# Patient Record
Sex: Female | Born: 1960 | Race: White | Hispanic: No | Marital: Married | Smoking: Never smoker
Health system: Southern US, Community
[De-identification: ages and names within clinical notes are randomized; demographics above are authoritative.]

## PROBLEM LIST (undated history)

## (undated) DIAGNOSIS — R002 Palpitations: Secondary | ICD-10-CM

## (undated) DIAGNOSIS — I493 Ventricular premature depolarization: Secondary | ICD-10-CM

## (undated) DIAGNOSIS — R519 Headache, unspecified: Secondary | ICD-10-CM

## (undated) HISTORY — DX: Ventricular premature depolarization: I49.3

## (undated) HISTORY — DX: Headache, unspecified: R51.9

## (undated) HISTORY — PX: MYOMECTOMY: SHX85

## (undated) HISTORY — DX: Palpitations: R00.2

## (undated) HISTORY — PX: TONSILLECTOMY: SUR1361

## (undated) HISTORY — PX: BREAST REDUCTION SURGERY: SHX8

---
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
## 1998-04-30 ENCOUNTER — Ambulatory Visit (HOSPITAL_COMMUNITY): Admission: RE | Admit: 1998-04-30 | Discharge: 1998-04-30 | Payer: Self-pay | Admitting: Obstetrics & Gynecology

## 1999-07-29 ENCOUNTER — Other Ambulatory Visit: Admission: RE | Admit: 1999-07-29 | Discharge: 1999-07-29 | Payer: Self-pay | Admitting: Obstetrics & Gynecology

## 2000-05-28 ENCOUNTER — Ambulatory Visit (HOSPITAL_COMMUNITY): Admission: RE | Admit: 2000-05-28 | Discharge: 2000-05-28 | Payer: Self-pay | Admitting: Obstetrics & Gynecology

## 2000-05-28 ENCOUNTER — Encounter: Payer: Self-pay | Admitting: Obstetrics & Gynecology

## 2000-11-29 ENCOUNTER — Other Ambulatory Visit: Admission: RE | Admit: 2000-11-29 | Discharge: 2000-11-29 | Payer: Self-pay | Admitting: Obstetrics & Gynecology

## 2000-12-14 HISTORY — PX: MYOMECTOMY: SHX85

## 2000-12-19 ENCOUNTER — Inpatient Hospital Stay (HOSPITAL_COMMUNITY): Admission: AD | Admit: 2000-12-19 | Discharge: 2000-12-19 | Payer: Self-pay | Admitting: Obstetrics & Gynecology

## 2000-12-24 ENCOUNTER — Observation Stay (HOSPITAL_COMMUNITY): Admission: AD | Admit: 2000-12-24 | Discharge: 2000-12-25 | Payer: Self-pay | Admitting: Obstetrics and Gynecology

## 2001-01-29 ENCOUNTER — Inpatient Hospital Stay (HOSPITAL_COMMUNITY): Admission: RE | Admit: 2001-01-29 | Discharge: 2001-01-31 | Payer: Self-pay | Admitting: Obstetrics & Gynecology

## 2001-02-01 ENCOUNTER — Inpatient Hospital Stay (HOSPITAL_COMMUNITY): Admission: AD | Admit: 2001-02-01 | Discharge: 2001-02-04 | Payer: Self-pay | Admitting: Obstetrics & Gynecology

## 2001-02-01 ENCOUNTER — Encounter (INDEPENDENT_AMBULATORY_CARE_PROVIDER_SITE_OTHER): Payer: Self-pay

## 2001-02-02 ENCOUNTER — Encounter: Payer: Self-pay | Admitting: Obstetrics & Gynecology

## 2001-03-16 ENCOUNTER — Inpatient Hospital Stay (HOSPITAL_COMMUNITY): Admission: RE | Admit: 2001-03-16 | Discharge: 2001-03-20 | Payer: Self-pay | Admitting: Obstetrics & Gynecology

## 2001-03-16 ENCOUNTER — Encounter (INDEPENDENT_AMBULATORY_CARE_PROVIDER_SITE_OTHER): Payer: Self-pay | Admitting: Specialist

## 2001-03-17 ENCOUNTER — Encounter: Payer: Self-pay | Admitting: Obstetrics & Gynecology

## 2002-02-21 ENCOUNTER — Encounter: Payer: Self-pay | Admitting: Obstetrics & Gynecology

## 2002-02-21 ENCOUNTER — Ambulatory Visit (HOSPITAL_COMMUNITY): Admission: RE | Admit: 2002-02-21 | Discharge: 2002-02-21 | Payer: Self-pay | Admitting: Obstetrics & Gynecology

## 2002-06-20 ENCOUNTER — Other Ambulatory Visit: Admission: RE | Admit: 2002-06-20 | Discharge: 2002-06-20 | Payer: Self-pay | Admitting: Obstetrics & Gynecology

## 2002-12-26 ENCOUNTER — Inpatient Hospital Stay (HOSPITAL_COMMUNITY): Admission: AD | Admit: 2002-12-26 | Discharge: 2002-12-30 | Payer: Self-pay | Admitting: Obstetrics & Gynecology

## 2003-02-08 ENCOUNTER — Other Ambulatory Visit: Admission: RE | Admit: 2003-02-08 | Discharge: 2003-02-08 | Payer: Self-pay | Admitting: Obstetrics & Gynecology

## 2003-10-17 ENCOUNTER — Ambulatory Visit (HOSPITAL_COMMUNITY): Admission: RE | Admit: 2003-10-17 | Discharge: 2003-10-17 | Payer: Self-pay | Admitting: Obstetrics & Gynecology

## 2004-07-11 ENCOUNTER — Other Ambulatory Visit: Admission: RE | Admit: 2004-07-11 | Discharge: 2004-07-11 | Payer: Self-pay | Admitting: Obstetrics & Gynecology

## 2004-12-10 ENCOUNTER — Ambulatory Visit (HOSPITAL_COMMUNITY): Admission: RE | Admit: 2004-12-10 | Discharge: 2004-12-10 | Payer: Self-pay | Admitting: Obstetrics & Gynecology

## 2005-10-19 ENCOUNTER — Other Ambulatory Visit: Admission: RE | Admit: 2005-10-19 | Discharge: 2005-10-19 | Payer: Self-pay | Admitting: Obstetrics & Gynecology

## 2006-03-10 ENCOUNTER — Ambulatory Visit (HOSPITAL_COMMUNITY): Admission: RE | Admit: 2006-03-10 | Discharge: 2006-03-10 | Payer: Self-pay | Admitting: Obstetrics & Gynecology

## 2007-05-27 ENCOUNTER — Ambulatory Visit (HOSPITAL_COMMUNITY): Admission: RE | Admit: 2007-05-27 | Discharge: 2007-05-27 | Payer: Self-pay | Admitting: Obstetrics & Gynecology

## 2008-06-14 ENCOUNTER — Ambulatory Visit (HOSPITAL_COMMUNITY): Admission: RE | Admit: 2008-06-14 | Discharge: 2008-06-14 | Payer: Self-pay | Admitting: Obstetrics & Gynecology

## 2008-08-29 ENCOUNTER — Encounter: Admission: RE | Admit: 2008-08-29 | Discharge: 2008-08-29 | Payer: Self-pay | Admitting: Obstetrics & Gynecology

## 2009-12-17 ENCOUNTER — Ambulatory Visit (HOSPITAL_COMMUNITY): Admission: RE | Admit: 2009-12-17 | Discharge: 2009-12-17 | Payer: Self-pay | Admitting: Obstetrics & Gynecology

## 2010-12-30 ENCOUNTER — Ambulatory Visit (HOSPITAL_COMMUNITY)
Admission: RE | Admit: 2010-12-30 | Discharge: 2010-12-30 | Payer: Self-pay | Source: Home / Self Care | Attending: Obstetrics & Gynecology | Admitting: Obstetrics & Gynecology

## 2011-01-04 ENCOUNTER — Encounter: Payer: Self-pay | Admitting: Obstetrics & Gynecology

## 2011-05-01 NOTE — H&P (Signed)
[facility]  Patient:    Lisa Kelley, Lisa Kelley                         MRN: [id number] Adm. Date:  [id number] Disc. Date: [id number] Attending:  Minette Headland                         History and Physical  ADMITTING DIAGNOSIS:          Multiple uterine leiomyomata.  HISTORY OF PRESENT ILLNESS:   Patient is a 50 year old white married female gravida 1, para 0, who recently had a pregnancy complicated by second-trimester bleeding, incompetent cervix, and preterm contractions probably as a secondary result of uterine fibroids and bleeding secondary to these.  She had an abdominal cerclage during that pregnancy in February of this year but subsequently had a spontaneous rupture of membranes and preterm nonviable birth.  This occurred on February 18.  She has been followed since that time with a gradual decrease in size of the uterus, but it has essentially stabilized at 12 to 14 week size with numerous irregular nodules confirmed by ultrasound.  Given that the fibroids are thought to have contributed to her pregnancy loss and her desire to conceive again, she is now admitted for myomectomy.  REVIEW OF SYSTEMS:            Her current review of systems is otherwise negative.  There are no cardiopulmonary, GI, or GU complaints.  PAST MEDICAL HISTORY:         Patient has no known significant medical illnesses.  She has previously had rhinoplasty at age 59, a tonsillectomy as a child, and she had the abdominal cerclage noted above.  She had excision of a benign tumor of her upper lip at the age of 6 weeks and several reconstructive plastic surgeries.  She has never had a blood transfusion.  She is not currently on medications on a chronic basis.  She does not use cigarettes.  FAMILY HISTORY:               Noncontributory.  PHYSICAL EXAMINATION:  HEENT:                        Grossly within normal limits.  Thyroid gland is not palpably enlarged.  CHEST:                         Clear to auscultation.  HEART:                        Normal sinus rhythm without murmurs, rubs, or gallops.  ABDOMEN:                      Soft.  There is a midline well-healed scar below the umbilicus.  The uterus is palpable above the symphysis and, on bimanual, the uterus is approximately [redacted] weeks gestational size.  There are no palpable adnexal masses, but the exam is compromised by the large size of the fibroids.  PELVIC:                       The cervix and vagina are normal in appearance at this point in time.  The external genitalia are normal.  ASSESSMENT:  Multiple uterine Leiomyomata.  Myomas thought to be precipitating factor in the loss of a second-trimester pregnancy.  PLAN:                         Myomectomy. DD:  03/15/01 TD:  03/15/01 Job: [id number]

## 2011-05-01 NOTE — H&P (Signed)
[facility] of [facility]  Patient:    Lisa Kelley, Lisa Kelley                         MRN: [id number] Adm. Date:  [id number] Attending:  Trevor Iha                         History and Physical  HISTORY OF PRESENT ILLNESS:   Ms. Standiford is a 50 year old G1, P0 who presented for evaluation by Dr. Jennette Kettle this morning for worsening pelvic pain.  She is prima gravida that has had severe pelvic pain for the last week to week and a half.  She is currently 13 5/7 weeks by last menstrual period.  She is known to have fibroids and presents with vaginal bleeding.  Her estimated date of confinement is June 26, 2001.  She has been on Vicodin and bed rest for the last week because of the pelvic pain.  Ultrasound at the office the fetus measured approximately 15 weeks with a fetal heart rate of 171.  No gross abnormalities were noted.  Two fibroids were noted on the posterior fundus 4.8 x 4.5 x 5.4 cm and the right lateral 7.2 x 6 x 6.5 cm were noted.  Physical examination per Dr. Jennette Kettle:  The cervix was bulging and approximately a 2 cm in length cervix was closed.  IMPRESSION:                   Threatened abortion, probable infarcted fibroids.  PLAN:                         Supportive care with IV fluids.  Morphine as needed for pain.  We will begin Motrin for the anti-inflammatory type of infection and hopefully reduce prostaglandins associated with an infarcted fibroid.  We will continue observation in the hospital during the time of severe pain.  LABORATORIES:                 Blood type O+.  Antibody negative.  Rubella immune.  Hemoglobin 12.3. DD:  12/24/00 TD:  12/24/00 Job: [id number]

## 2011-05-01 NOTE — Discharge Summary (Signed)
NAME:  Lisa Kelley, Lisa Kelley                            ACCOUNT NO.:  [id number]   MEDICAL RECORD NO.:  [id number]                   PATIENT TYPE:  INP   LOCATION:  9135                                 FACILITY:  [facility]   PHYSICIAN:  Freddy Finner, M.D.                DATE OF BIRTH:  [birth year]   DATE OF ADMISSION:  12/26/2002  DATE OF DISCHARGE:  12/30/2002                                 DISCHARGE SUMMARY   ADMISSION DIAGNOSES:  1. Intrauterine pregnancy at 37-5/7 weeks estimated gestational age.  2. Surgically scarred uterus.  3. 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   DISCHARGE DIAGNOSES:  1. Status post low transverse cesarean section.  2. Viable female infant.   PROCEDURE:  Primary low transverse cesarean section.   REASON FOR ADMISSION:  Please see dictated H&P.   HOSPITAL COURSE:  The patient is a 50 year old, white married female, G2, P1  that presented to [facility] for a scheduled cesarean  delivery.  The patient had history of a second-trimester loss with her first  pregnancy due to incompetent cervix.  The patient had an abdominal Cerclage  placed at the time of a myomectomy in April 2002.  Pregnancy had otherwise  been uncomplicated.  Due to history of cervical incompetence, Delalutin had  been administered weekly for the first 10 weeks of her pregnancy.  Amniocentesis had been scheduled for lung maturity on the day preceding the  cesarean section, however, adequate pocket of fluid was not achievable  during the course of that amniocentesis.  On the a.m. of her surgery, the  patient was prepped accordingly and taken to the operating room where spinal  anesthesia was administered without difficulty.  A low transverse incision  was made with the delivery of a viable female infant weighing 5 pounds 15  ounces with Apgar's at 9 and 9 at one and five minutes.  The patient  tolerated the procedure well and was taken to the recovery room in  stable  condition.  On postop day #1, urine was noted with gross hematuria.  Urine  output was within normal limits.  The patient had good return of bowel  function.  Abdomen was soft and slightly distended.  Abdominal dressing was  noted to be clean, dry and intact.  Labs revealed hemoglobin of 9.3,  platelet count of 140,000 and WBC of 7.9.  On postop day #2, the patient  complained of some pressure with voiding.  Vital signs were stable.  Abdomen  was soft and nontender.  Incision was clean, dry and intact.  The patient  was tolerating a regular diet without complaints of nausea and vomiting.  Ferrous sulfate and iron supplementation was started.  On postop day #3,  incision was clean, dry and intact.  The patient was ambulating well.  On  postop day #4, the patient was voiding without difficulty.  The incision  was  clean, dry and intact.  Staples were removed and the patient was discharged  home.   CONDITION ON DISCHARGE:  Good.   DIET:  Regular, as tolerated.   ACTIVITY:  No heavy lifting, no driving x2 weeks, no vaginal entry.   FOLLOW UP:  The patient is to follow up in the office in one to two weeks  for an incision check.   SPECIAL INSTRUCTIONS:  She is to call for temperature greater than 100  degrees, persistent nausea and vomiting, heavy vaginal bleeding and any  redness or drainage from the incisional site.   DISCHARGE MEDICATIONS:  1. Vicodin #30, one p.o. every four to six hours p.r.n. pain with one     refill.  2. Motrin 600 mg every six hours.  3. Ferrous sulfate 325 mg one p.o. b.i.d. with meals.  4. Prenatal vitamins one p.o. daily.  5. Colace one p.o. daily p.r.n.     Julio Sicks, N.P.                        Freddy Finner, M.D.    CC/MEDQ  D:  01/30/2003  T:  01/30/2003  Job:  [id number]

## 2011-05-01 NOTE — Discharge Summary (Signed)
[facility] of [facility]  Patient:    Lisa Kelley, Lisa Kelley                         MRN: [id number] Adm. Date:  [id number] Disc. Date: [id number] Attending:  Minette Headland Dictator:   Danie Chandler, R.N.                           Discharge Summary  ADMITTING DIAGNOSES:          1. Intrauterine pregnancy at approximately 18                                  6/[redacted] weeks gestation with chromosomally normal                                  fetus, incompetent cervix with no cervix to                                  work with vaginally confirmed by clinical and                                  ultrasound examination.                               2. Uterine leiomyomata.  DISCHARGE DIAGNOSES:          1. Intrauterine pregnancy at approximately 18                                  6/[redacted] weeks gestation with chromosomally normal                                  fetus, incompetent cervix with no cervix to                                  work with vaginally confirmed by clinical                                  ultrasound examination.                               2. Uterine leiomyomata.  PROCEDURE:                    On January 28, 2001 abdominal cerclage.  REASON FOR ADMISSION:         The patient is a 50 year old married white female gravida 1, para 0 who has a complicated pregnancy including significant second trimester bleeding, known uterine leiomyomata, and incompetent cervix. After obtaining a normal chromosomal analysis the patient was admitted for an abdominal cerclage.  HOSPITAL COURSE:              The patient was taken to the operating room and  underwent the above named procedure without complications.  On postoperative day #1 the patient was complaining of some right-sided pain with slight bleeding.  The uterus was mildly with mild contractions, positive fetal heart tones.  The patient was placed on magnesium sulfate to relax the uterus.  On postoperative day #1  the patient was having rare bleeding.  Hemoglobin was 9.3.  Fetal heart tones were reactive.  She was having some right nerve pain and she was very sensitive to touch.  Her magnesium sulfate was discontinued and she was placed on Motrin around the clock.  On postoperative day #2 she was feeling much better.  Her abdomen was soft with positive bowel sounds and positive fetal heart tones.  She was discharged home on the following day. She continued to feel better.  She did have good return of bowel function and was tolerating a regular diet.  Hemoglobin was stable at 9.4.  There were positive fetal heart tones.  Her incision was clean, dry, and intact.  She was discharged home on this day.  CONDITION ON DISCHARGE:       Good.  DIET:                         Regular, as tolerated.  ACTIVITY:                     She is to be on bed rest with bathroom privileges until returning to the office for followup visit.  She is to call for any signs or symptoms of infection as well as any unusual pain or bleeding.  DISCHARGE MEDICATIONS:        1. Prenatal vitamins one p.o. q.d.                               2. Vicodin #30 one to two p.o. q.6h. p.r.n.                                  pain.                               3. Zofran 8 mg #10 with no refill as directed by                                  M.D. DD:  02/22/01 TD:  02/22/01 Job: [id number]

## 2011-05-01 NOTE — Discharge Summary (Signed)
[facility] of [facility]  Patient:    CHEN, HOLZMAN                         MRN: [id number] Adm. Date:  [id number] Disc. Date: [id number] Attending:  Minette Headland Dictator:   Danie Chandler, R.N.                           Discharge Summary  ADMITTING DIAGNOSES:          Intrauterine pregnancy at 18+ weeks gestation with incompetent cervix.  DISCHARGE DIAGNOSES:          1. Intrauterine pregnancy at 18+ weeks gestation                                  with incompetent cervix.                               2. Precipitous delivery of a nonviable female on                                  February 03, 2001 at [place] with Apgars of 0                                  and 0.  PROCEDURE:                    Precipitous delivery on February 03, 2001 of a nonviable ______ infant at [employer] 0/0 over an intact perineum.  REASON FOR ADMISSION:         Please see dictated H&P.  HOSPITAL COURSE:              Patient was admitted on February 01, 2001 due to incompetent cervix with progressive cervical change.  She was on p.o. terbutaline.  Her vital signs were stable.  She was placed on Unasyn, continued on terbutaline and on hospitalization day #1 she continued rest and Trendelenburg position but later that day the patient delivered precipitously over an intact perineum a nonviable ______ with Apgars of 0 and 0 at 1835. She was discharged home on the following day in good condition.  LABORATORIES:                 Hemoglobin 9.5, hematocrit 27.9, white blood cell count 8.9.  CONDITION ON DISCHARGE:       Good.  DIET:                         Regular, as tolerated.  ACTIVITY:                     Progressive increase.  FOLLOW-UP:                    One week.  She is to call for any heavy bleeding, any temperature above 100 degrees.  DISCHARGE MEDICATIONS:        1. Prenatal vitamin one p.o. q.d.  2. Iron one p.o. q.d.              3. Vicodin one to two p.o. q.6h. p.r.n. pain. DD:  02/17/01 TD:  02/17/01 Job: [id number]

## 2011-05-01 NOTE — Op Note (Signed)
[facility] of [facility]  Patient:    Lisa Kelley, Lisa Kelley                         MRN: [id number] Proc. Date: 03/16/01 Adm. Date:  [id number] Attending:  Minette Headland                           Operative Report  PREOPERATIVE DIAGNOSES:       Multiple uterine leiomyomata thought to have                               contributed to a second trimester pregnancy                               loss. Abdominal cerclage placed during that                               pregnancy still in place.   POSTOPERATIVE DIAGNOSES:      Multiple uterine leiomyomata thought to have                               contributed to a second trimester pregnancy                               loss. Abdominal cerclage placed during that                               pregnancy still in place.  OPERATION:                    Replacement of cervical cerclage and multiple                               myomectomy total number of 14 fibroids removed                               at least one with definite evidence of                               degeneration and probably three lesions                               distorting the endometrial cavity.  SURGEON:                      Freddy Finner, M.D.  ASSISTANT:                    Trevor Iha, M.D.  ANESTHESIA:                   General endotracheal anesthesia.  ESTIMATED BLOOD LOSS:         600 cc.  INTRAOPERATIVE COMPLICATIONS: None.  INDICATIONS:  Please refer to the history of present illness recorded in the admission note.  DESCRIPTION OF PROCEDURE:     The patient was admitted on the morning of surgery. She was taken to the operating room after receiving a bolus of Ceftin IV. She was placed in PIS hose. Once arrival in the operating room, she was placed under adequate general endotracheal anesthesia and placed in the dorsal recumbent position. The abdomen, perineum and vagina were prepped in the usual fashion  and a Foley catheter was placed using sterile technique. Sterile drapes were applied.  A lower abdominal midline scar was sharply excised. The incision was carried sharply down to fascia which was entered sharply and extended to the extent of the skin incision. The rectus muscles were divided sharply. The peritoneum was entered bluntly with digital exploration and the peritoneal incision extended to the extent of the skin incision. Palpation of the upper abdomen revealed no apparent abnormalities of liver, kidneys or other organs that were obvious. The appendix was visualized and was normal. A self-retaining OConnor-OSullivan retractor was placed. Moist packs were used to pack the intestinal contents out of the pelvis. The uterus was elevated into the incision and in the process of elevating the uterus, the scar in the peritoneum overlying the bladder avulsed revealing the cervix and part of the suture underneath. There was no injury to bladder. Numerous fibroids were then removed individually including at least three dominant ones which impinged on the endometrial cavity and one that was very large and irregular in configuration and traveled through the broad ligament along the right side at the level of the uterine artery. In the process of removing this, it was felt that the uterine artery probably was entered and this was ligated. Each defect created by removal of the fibroids was closed. At least one entered the endometrial cavity and this was carefully inverted in the process of the closure. Great care was exercised to avoid the insertion of the fallopian tubes into the uterine cavity. Exploration of the cavity itself did reveal impingement of at least one fibroid superior to the one noted above which passed through the broad ligament, this too was removed but done through a separate incision. Each incision was closed with deep layers of #0 Monocryl suture and superficial layers with  3-0 PDS suture. After carefully removing all palpable and visible lesions, the old suture was removed, a new suture was placed higher on the cervix at the level of the lower uterine segment. The bladder flap was reapproximated with a 3-0 PDS. All defects in the peritoneum were closed with 3-0 PDS. With adequate hemostasis throughout and after copious irrigation, all instruments were removed; the packs were removed. The sponge, needle, lap and instrument counts were correct. The abdominal incision was closed in two layers, a single layer closed the peritoneum and the fascia with #0 Panacryl. The skin was closed with wide skin staples. The subcutaneous bleeders were controlled with the Bovie prior to closing the skin. The patient tolerated the operative procedure well and was taken to recovery in good condition. DD:  03/16/01 TD:  03/16/01 Job: [id number]

## 2011-05-01 NOTE — H&P (Signed)
NAME:  Lisa Kelley, Lisa Kelley                            ACCOUNT NO.:  [id number]   MEDICAL RECORD NO.:  [id number]                   PATIENT TYPE:   LOCATION:                                       FACILITY:  [facility]   PHYSICIAN:  Freddy Finner, M.D.                DATE OF BIRTH:  [birth year]   DATE OF ADMISSION:  12/26/2002  DATE OF DISCHARGE:                                HISTORY & PHYSICAL   ADMISSION DIAGNOSES:  Intrauterine pregnancy 37 and 3/[redacted] weeks gestation,  incompetent cervix with abdominal cerclage in place, surgically scarred  uterus from previous deep myomectomy.   HISTORY OF PRESENT ILLNESS:  The patient is a 50 year old white married  female, gravida 2, para 0, who had a second trimester pregnancy loss with  her first pregnancy due to incompetent cervix.  At the present time, she has  an abdominal cerclage placed at the time of myomectomy done in April 2002.  This pregnancy has been essentially uncomplicated although followed very,  very closely.  The patient, in addition to having the abdominal cerclage,  was given Delalutin weekly for approximately 10 weeks during the course of  her pregnancy.  She has now advanced to 37 and 3/[redacted] weeks gestation with no  prenatal complications.  Ultrasound on the day prior to admission showed the  infant to be 3155 gm, normal AFI.  An adequate pocket for amniocentesis was  not achievable during the course of that ultrasound, and the planned  amniocentesis was not performed for fear of cord injury to the fetus during  the procedure.  The patient is now admitted for cesarean delivery.   REVIEW OF SYSTEMS:  Negative.  There are no cardiopulmonary, GI or GU  complaints.   PAST MEDICAL HISTORY:  Is outlined in detail in the prenatal summary and  will not be repeated at this time.   The patient is known to have a positive group B strep done on December 13, 2002.   PHYSICAL EXAMINATION:  HEENT:  Grossly within normal limits.  VITAL SIGNS:  Blood  pressure in the office was 110/74.  NECK:  Thyroid gland is not palpably enlarged.  CHEST:  Clear to auscultation.  HEART:  Normal sinus rhythm without murmurs, rubs, or gallops.  ABDOMEN:  Gravid, fundal height of 36 cm.  Ultrasound findings as noted  above.  PELVIC EXAM:  Deferred.  EXTREMITIES:  Without cyanosis, clubbing, or edema.   ASSESSMENT:  Intrauterine pregnancy, 37 and 3/[redacted] weeks gestation, now  admitted for cesarean delivery.                                               Freddy Finner, M.D.    WRN/MEDQ  D:  12/26/2002  T:  12/26/2002  Job:  [id number]

## 2011-05-01 NOTE — Op Note (Signed)
[facility]  Patient:    Lisa Kelley, Lisa Kelley                         MRN: [id number] Proc. Date: 01/28/01 Adm. Date:  [id number] Disc. Date: [id number] Attending:  Trevor Iha                           Operative Report  PREOPERATIVE DIAGNOSES:       1. Intrauterine pregnancy at approximately                                  18-6/7th weeks gestation, chromosomally                                  normal fetus, incompetent cervix, with no                                  cervix to work with vaginally, confirmed                                  by clinical and ultrasound examinations.                               2. Uterine leiomyomata.  POSTOPERATIVE DIAGNOSES:      1. Intrauterine pregnancy at approximately                                  18-6/7th weeks gestation, chromosomally                                  normal fetus, incompetent cervix, with no                                  cervix to work with vaginally, confirmed                                  by clinical and ultrasound examinations.                               2. Uterine leiomyomata.  OPERATION:                    Abdominal cerclage.  SURGEON:                      Freddy Finner, M.D.  ASSISTANT:                    Juluis Mire, M.D.  ANESTHESIA:                   Spinal.  ESTIMATED INTRAOPERATIVE BLOOD LOSS:  50 cc or less.  INTRAOPERATIVE COMPLICATIONS: None.  INDICATIONS:  The patient is a 50 year old gravida 1, who has had a complicated pregnancy, including significant second trimester bleeding, known uterine leiomyomata, and incompetent cervix.  After obtaining a normal chromosomal analysis, she is now admitted for an abdominal cerclage.  DESCRIPTION OF PROCEDURE:     She was admitted on the morning of surgery and brought to the operating room, and there placed under adequate spinal anesthesia.  She was placed in the dorsal recumbent position, with  elevation of the right hip.  The abdomen was prepped in the usual fashion.  A Foley catheter was placed using a sterile technique.  Because of the large size of the uterus, extending above the umbilicus, it was elected to perform a vertical lower abdominal midline incision.  This was done sharply, and extended with careful dissection down to the peritoneum, which was entered sharply and extended sharply to the extent of the skin incision.  The uterus was then delivered through the abdominal incision, and using various retractors, including Deavers, and Richardson retractors, the lower segment was identified.  The bladder flap was developed with a transverse incision overlying the lower uterine segment.  The bladder was pushed off of the cervix.  The vessels were identified at their insertion into the uterus, and a Babcock clamp placed across the uterine artery pedicles on each side sequentially.  Using a 5.0 mm Mersilene tape on the blunt-tipped large needles, the suture was placed medial to the vessels and then tied anteriorly, with a total of four knots.  There was no significant intra-abdominal bleeding, no injury to vessels.  The bladder flap was reapproximated with #3-0 chromic suture.  The uterus was placed back into the abdominal cavity.  The abdominal incision was closed in layers with running #0 Vicryl used to close the peritoneum.  The fascia was closed with running #0 Panacryl.  The skin was closed with wide skin staples.  The patient tolerated the operative procedure well and was taken to the recovery room in good condition. DD:  01/28/01 TD:  01/28/01 Job: [id number]

## 2011-05-01 NOTE — Op Note (Signed)
NAME:  SHACARRA, CHOE                            ACCOUNT NO.:  [id number]   MEDICAL RECORD NO.:  [id number]                   PATIENT TYPE:  INP   LOCATION:  9135                                 FACILITY:  [facility]   PHYSICIAN:  Freddy Finner, M.D.                DATE OF BIRTH:  1961/09/04   DATE OF PROCEDURE:  12/26/2002  DATE OF DISCHARGE:                                 OPERATIVE REPORT   PREOPERATIVE DIAGNOSES:  1. Intrauterine pregnancy, 37-3/7 weeks' gestation.  2. Surgically scarred uterus.  3. Cervical incompetence with cervical cerclage in place.   POSTOPERATIVE DIAGNOSES:  1. Intrauterine pregnancy, 37-3/7 weeks' gestation.  2. Surgically scarred uterus.  3. Cervical incompetence with cervical cerclage in place.  4. Delivery of viable female infant.   OPERATIVE PROCEDURE:  1. Primary low transverse cervical cesarean section.  2. Delivery of viable female infant, Apgars of 9 and 9.   ANESTHESIA:  Spinal.   ESTIMATED BLOOD LOSS:  600 cc.   COMPLICATIONS:  None.   FINDINGS:  Cervical cerclage was in place with knot anterior on the lower  uterine segment.  Adhesions in the cul-de-sac are consistent with placement  of previous cerclage, obscuring view of cerclage posteriorly.  No other  adhesions were noted, no fibroids.  The tubes and ovaries were normal.   INDICATIONS FOR PROCEDURE:  The patient is a 50 year old admitted on the  morning of surgery for delivery by cesarean.  The details of the present  illness are recorded in the admission note.   DESCRIPTION OF PROCEDURE:  She was brought to the operating room and placed  under adequate spinal anesthesia, placed in the dorsal recumbent position  with elevation of the right hip of 15 degrees.  Abdomen was prepped and  draped in the usual fashion.  Foley catheter was placed using sterile  technique.  Sterile drapes were applied.  Low abdominal transverse incision  was made and carried sharply down to the fascia.  The  fascia was entered  sharply and extended to the skin incision.  Rectus sheath was developed  superiorly and inferiorly with a blunt and sharp dissection.  Rectus muscles  were divided in the midline.  Peritoneum was entered sharply and extended to  the skin incision.  Bladder block was placed.  Due to the presence of the  cerclage, it was elected not to dissect the bladder off the lower segment.  An incision was made in the lower uterine segment, approximately 1 cm to 1.5  cm above the reflection of the bladder peritoneum.  This was carried sharply  down to membranes which were entered.  Fluid was clear.  Uterine incision  was fixed into transversely with bandage scissors.  Palpation of the fetal  presentation revealed the infant to be oblique with the head in the right  mid portion of the uterus, the buttocks in the right upper  quadrant.  With  manual manipulation the head was guided to the lower segment and a Kiwi  vacuum extractor placed and an easy delivery then accomplished without  difficulty.  A length of free cord was reduced.  The infant's shoulders were  then delivered.  Apgars were 9 and 9.  Cord blood was obtained for arterial  sample for gases and for routine venous sample.  Placenta and other parts of  conception were removed from the uterus.  This was confirmed by manual  exploration of the uterus.  Uterus was delivered through the abdominal  incision for careful inspection and findings are noted above.  The uterine  incision was then closed in a single layer with running locking 0 Monocryl.  Figure-of-eights were required in three locations for complete hemostasis.  A Bovie was also used for small oozing bleeding sources.  Uterus was  delivered back into the abdominal cavity.  Irrigation was carried out.  All  packs, needles, and instruments were removed.  Counts were correct.  Abdominal incision was then closed in layers.  Running 0 Monocryl was used  to close the  peritoneum and reapproximate the rectus muscles.  Fascia was  closed with running 0 PDS.  Subcutaneous tissue was approximated with  running 2-0 plain.  Skin was closed with rod, skin staples, and quarter inch  Steri-Strips.  The patient tolerated the operative procedure well and was  taken to the recovery room in good condition.                                               Freddy Finner, M.D.    WRN/MEDQ  D:  12/26/2002  T:  12/26/2002  Job:  [id number]

## 2011-05-01 NOTE — Discharge Summary (Signed)
[facility]  Patient:    Lisa Kelley, Lisa Kelley                         MRN: [id number] Adm. Date:  [id number] Disc. Date: [id number] Attending:  Minette Headland                           Discharge Summary  ADMITTING DIAGNOSES:          Multiple uterine leiomyomata with contribution to the previous pregnancy loss, cervical incompetence, presence of cervical cerclage which was replaced in surgery.  OPERATIVE PROCEDURE:          Multiple myomectomy, replacement of abdomen cerclage.  COMPLICATIONS:                Postoperative:  Temperature elevation, slow progression of GI function.  DISPOSITION:                  At the time of her discharge on the fourth postoperative day the patient had been afebrile for at least 24 hours.  She was having adequate bowel and bladder function.  She was ambulating without difficulty.  Her condition was considered to be satisfactory and she was discharged home at that time to be followed up in the office in approximately one week.  She was given appropriate analgesics to be taken by mouth in the form of Vicodin.  She was given iron supplementation for anemia.  She was given oral antibiotics per Dr. Arelia Sneddon and discharged.  The details of the present illness, past history, family history, review of systems, and physical examination are recorded in the admission note and in the operative summary.  The patient had a complicated first pregnancy with incompetent cervix, multiple leiomyomata, placental abruption, cervical incompetence.  It was felt that in this particular case the fibroids more than likely contributed to the loss of that pregnancy and for that reason she is now admitted for myomectomy.  PHYSICAL EXAMINATION  PELVIC:                       Marked enlargement of the uterus to approximately [redacted] weeks gestational size.  LABORATORIES:                 Admission hemoglobin 12.8, postoperative hemoglobin 8.3 on the  first postoperative day, 7.8 on the second, 8.4 later on the second postoperative day, white count elevated on admission, slightly elevated on the second postoperative day.  Prothrombin time, PTT, urinalysis were normal on admission.  HOSPITAL COURSE:              Patient was admitted on the morning of surgery, taken to the operating room where the above described surgical procedure was accomplished including replacement of what was a ______ abdominal cervical cerclage.  Her postoperative course was remarkable for temperature elevation to 101.5 on the second postoperative day which responded rapidly to antibiotics and further conservative measures.  By the morning of the fourth postoperative day her condition was good.  She was discharged with disposition as noted above. DD:  07/12/01 TD:  07/12/01 Job: [id number]

## 2011-08-31 ENCOUNTER — Inpatient Hospital Stay (INDEPENDENT_AMBULATORY_CARE_PROVIDER_SITE_OTHER)
Admission: RE | Admit: 2011-08-31 | Discharge: 2011-08-31 | Disposition: A | Discharge status: Home / Self Care | Payer: 59 | Source: Ambulatory Visit | Attending: Family Medicine | Admitting: Family Medicine

## 2011-08-31 ENCOUNTER — Ambulatory Visit (INDEPENDENT_AMBULATORY_CARE_PROVIDER_SITE_OTHER): Payer: 59

## 2011-08-31 DIAGNOSIS — S93409A Sprain of unspecified ligament of unspecified ankle, initial encounter: Secondary | ICD-10-CM

## 2011-08-31 IMAGING — CR DG ANKLE COMPLETE 3+V*R*
3 series · 3 of 3 positions shown · non-contrast
Comparison: None

CLINICAL DATA: 49-year-old female with right ankle injury and pain.

RIGHT ANKLE - COMPLETE 3+ VIEW

[view not recorded (1 of 3)]
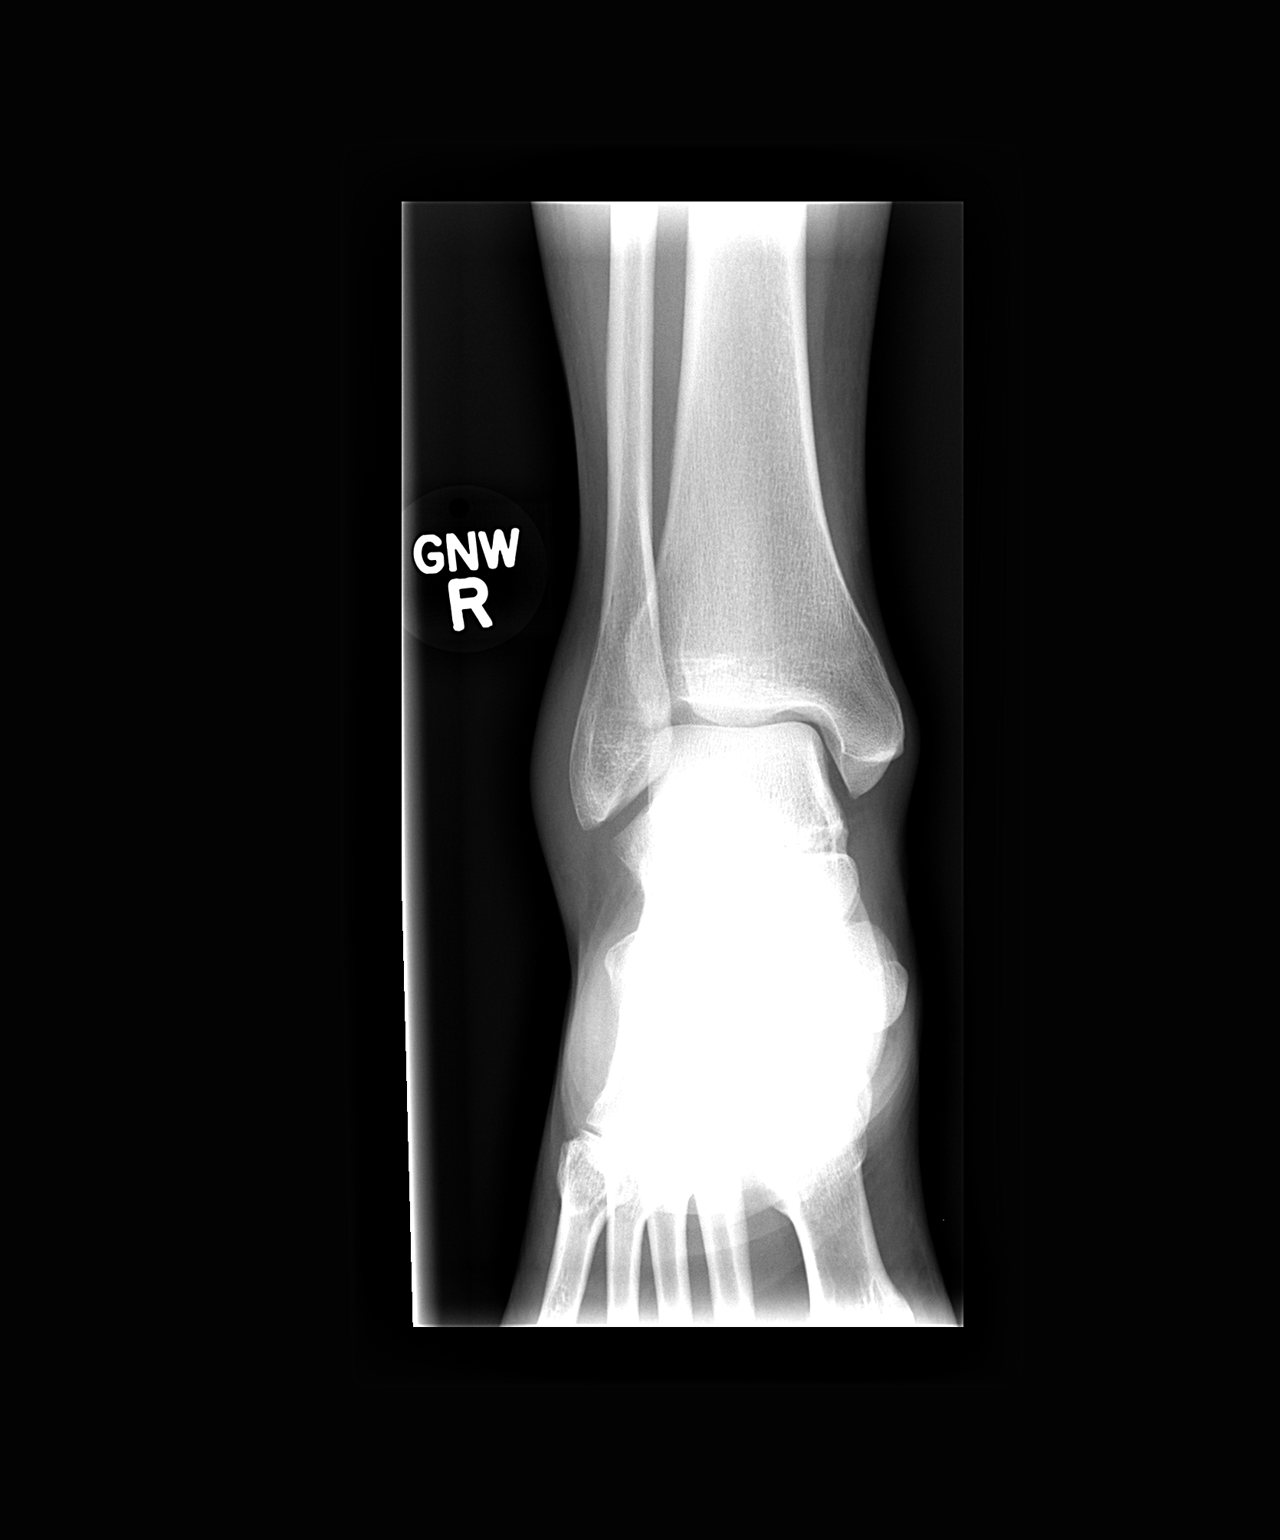

[view not recorded (2 of 3)]
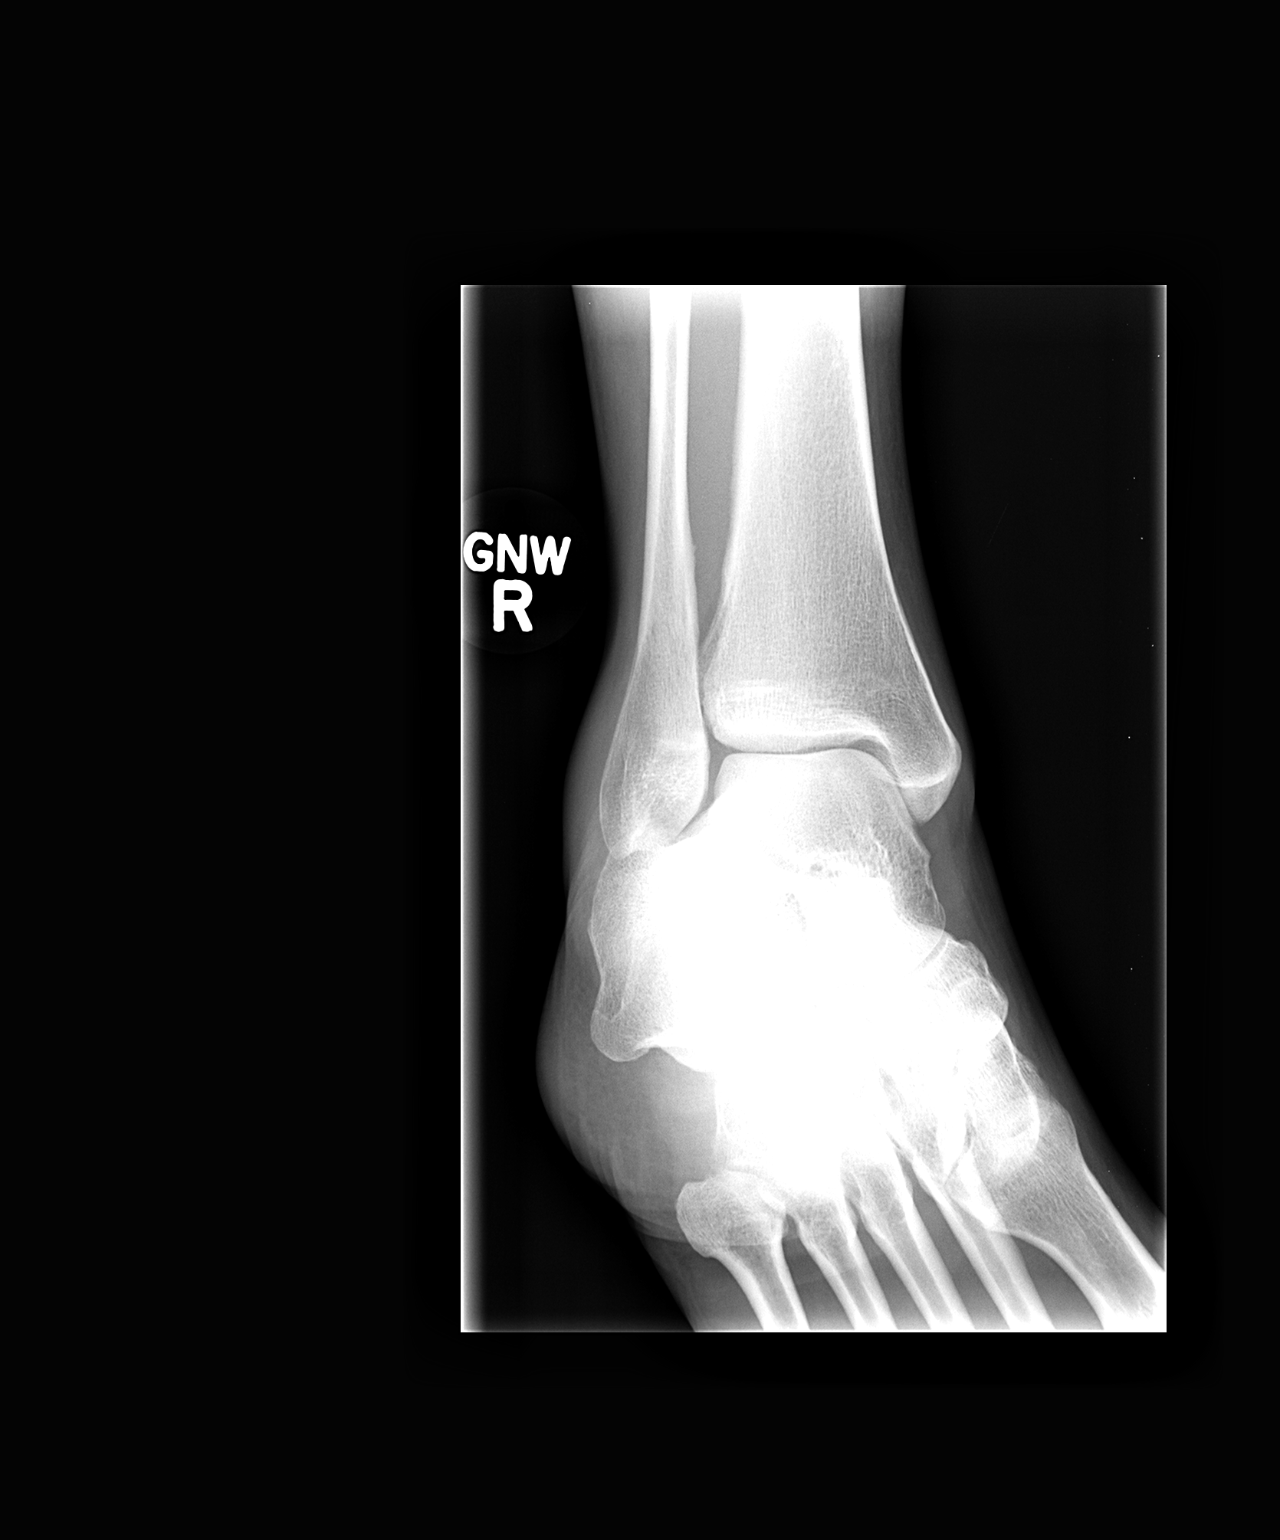

[view not recorded (3 of 3)]
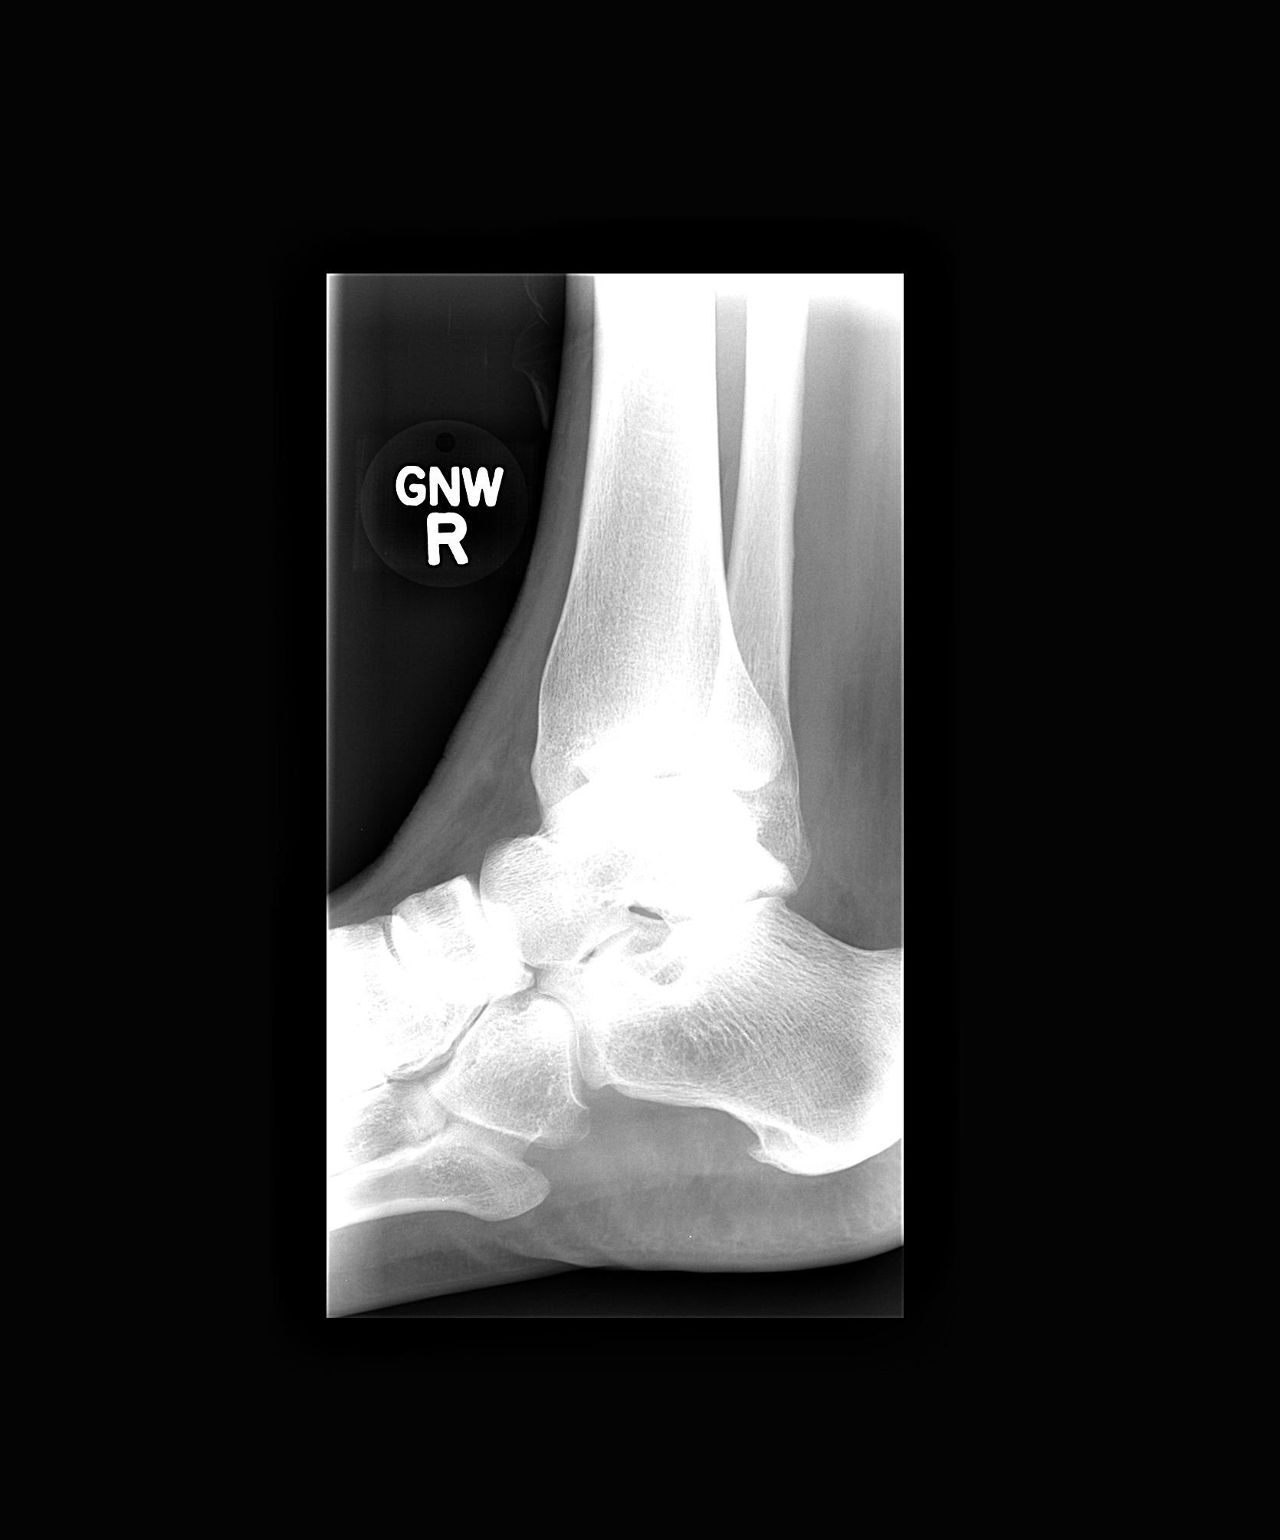

[3 of 3 positions shown; findings below may reference images not displayed]

FINDINGS: No evidence of acute fracture, subluxation or dislocation
identified.

No radio-opaque foreign bodies are present.

No focal bony lesions are noted.

The joint spaces are unremarkable.

Lateral soft tissue swelling is noted.
IMPRESSION: Soft tissue swelling without acute bony abnormality.

## 2013-12-29 ENCOUNTER — Other Ambulatory Visit (HOSPITAL_COMMUNITY): Payer: Self-pay | Admitting: Obstetrics & Gynecology

## 2013-12-29 DIAGNOSIS — Z1231 Encounter for screening mammogram for malignant neoplasm of breast: Secondary | ICD-10-CM

## 2014-01-03 ENCOUNTER — Ambulatory Visit (HOSPITAL_COMMUNITY)
Admission: RE | Admit: 2014-01-03 | Discharge: 2014-01-03 | Disposition: A | Discharge status: Home / Self Care | Payer: PRIVATE HEALTH INSURANCE | Source: Ambulatory Visit | Attending: Obstetrics & Gynecology | Admitting: Obstetrics & Gynecology

## 2014-01-03 DIAGNOSIS — Z1231 Encounter for screening mammogram for malignant neoplasm of breast: Secondary | ICD-10-CM

## 2014-01-03 IMAGING — MG STANDARD SCREENING - COMBOHD
8 series · 9 of 24 positions shown · non-contrast
Comparison: Previous exam(s).

CLINICAL DATA: Screening.

EXAM:
DIGITAL SCREENING BILATERAL MAMMOGRAM WITH 3D TOMO WITH CAD

[R CC]
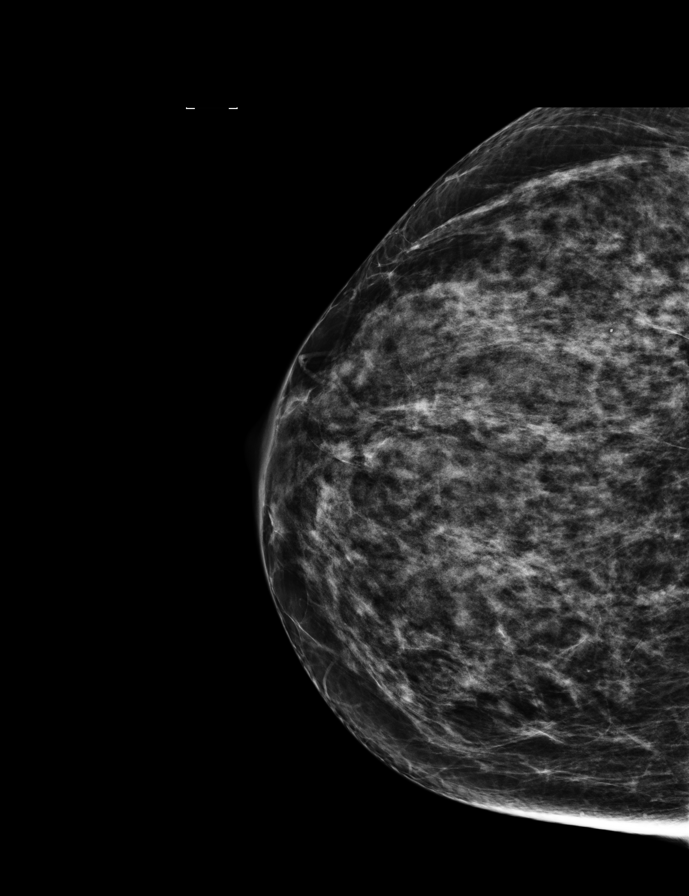

[L CC]
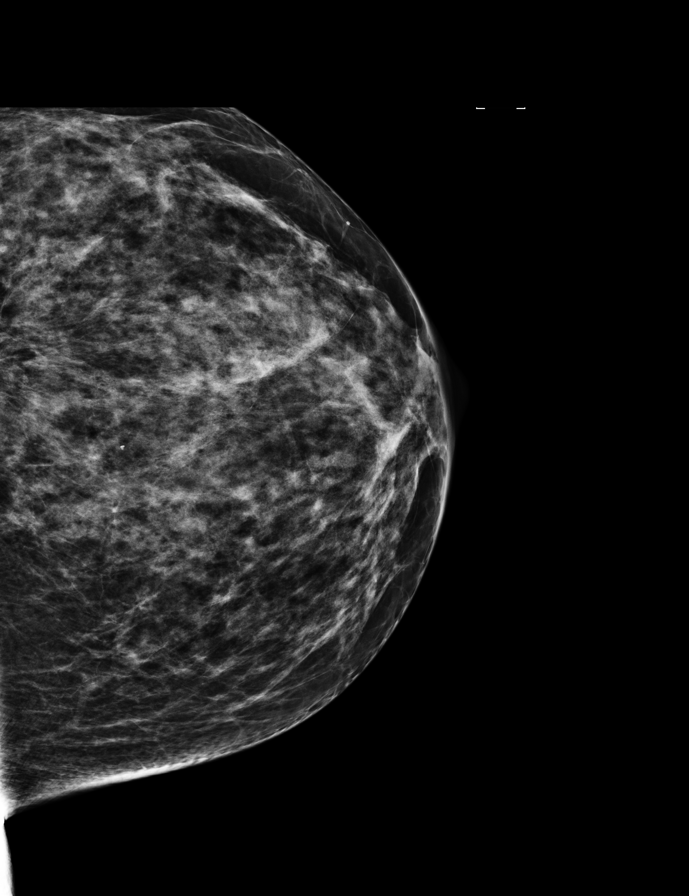

[R MLO]
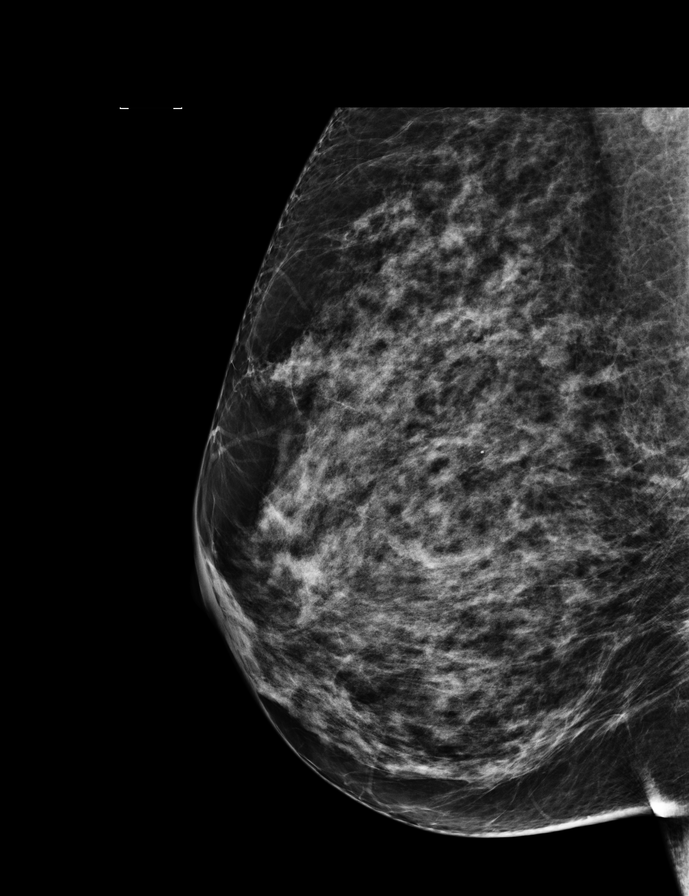

[L MLO]
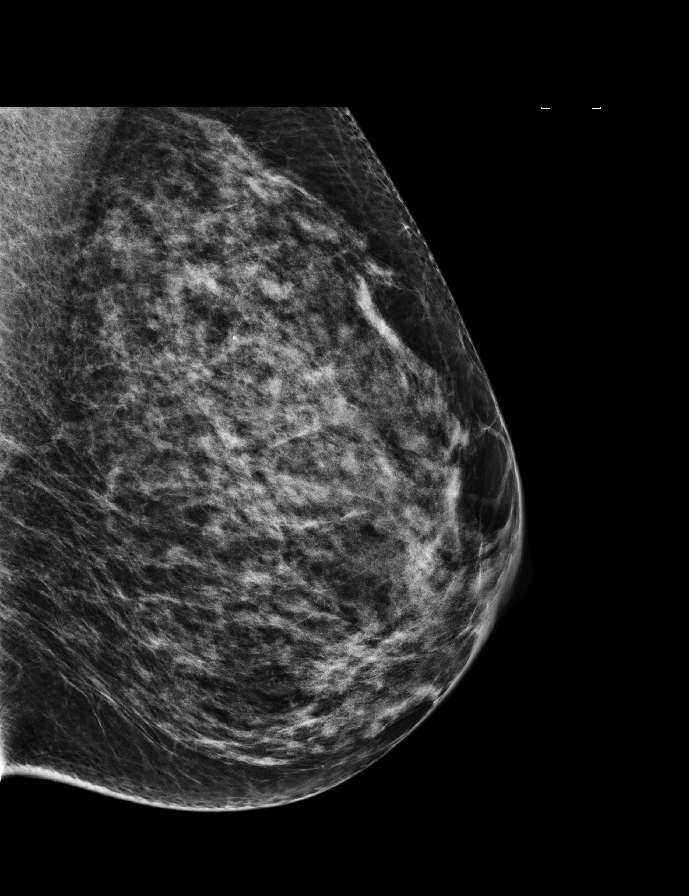

[L CC tomo · 2 of 77 frames shown]
[frame 25/77]
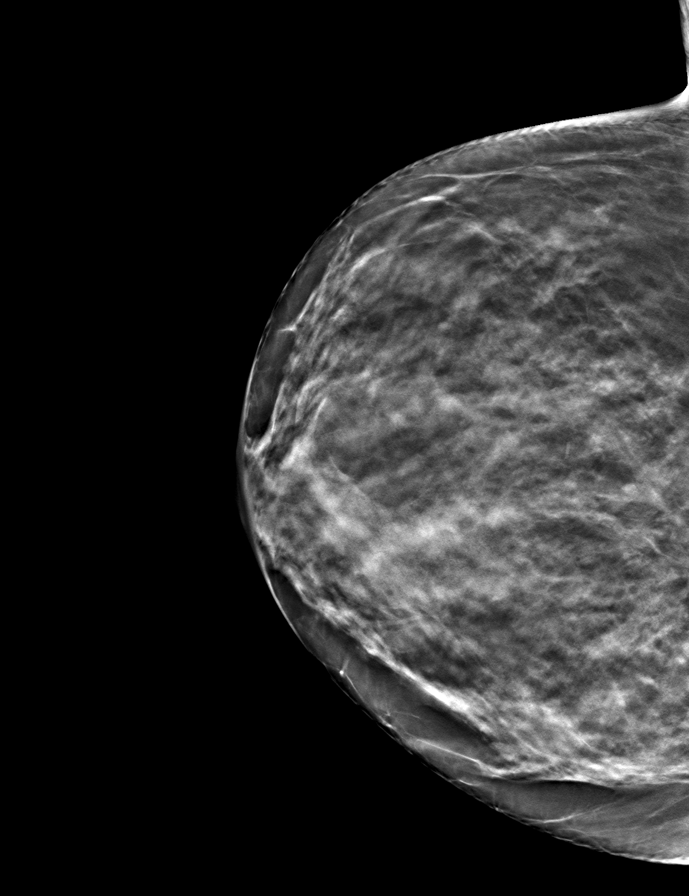
[frame 39/77]
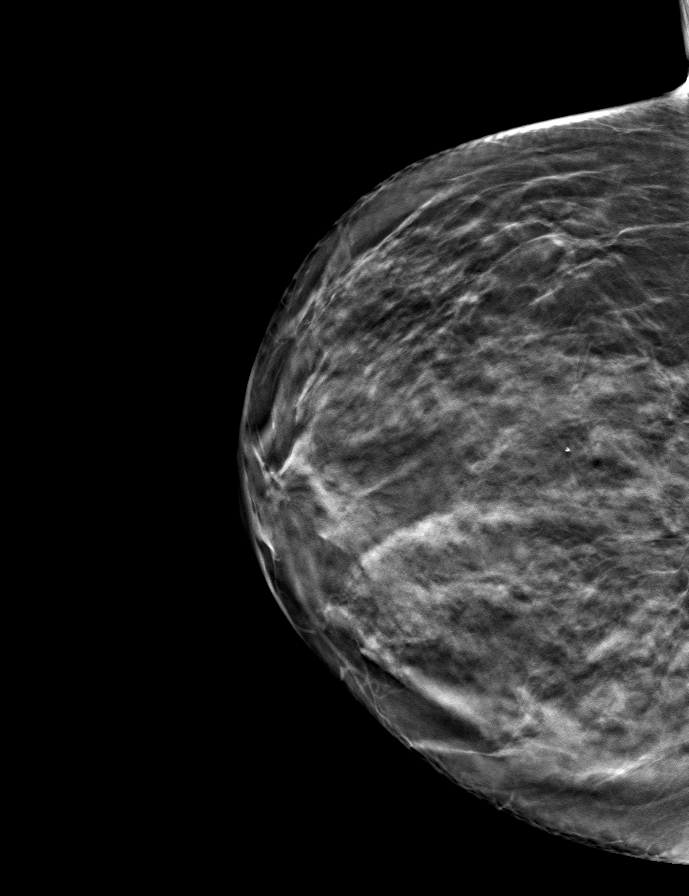

[R MLO tomo · tomo slice 39/78.0]
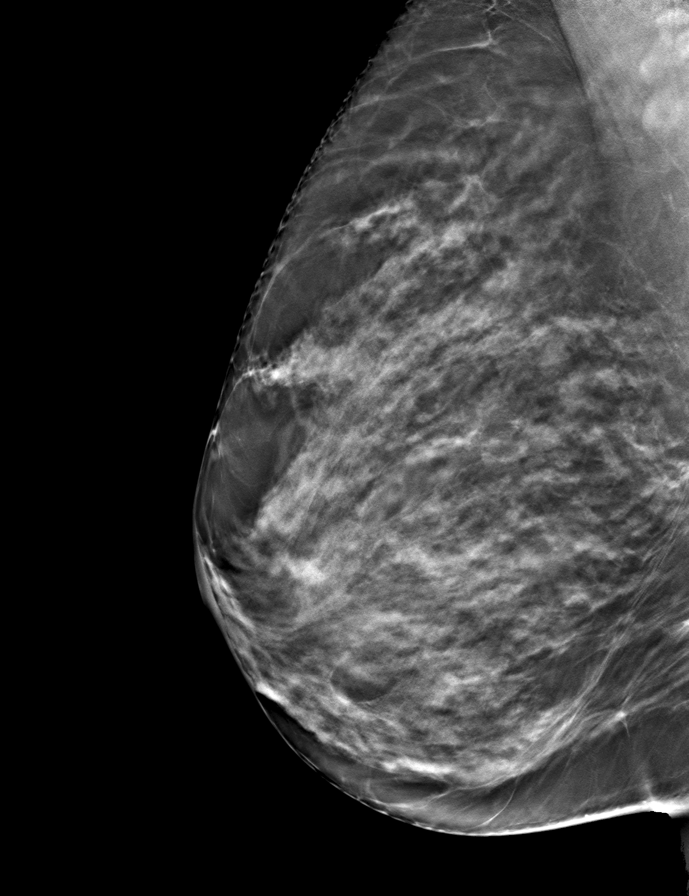

[L MLO tomo · tomo slice 41/80.0]
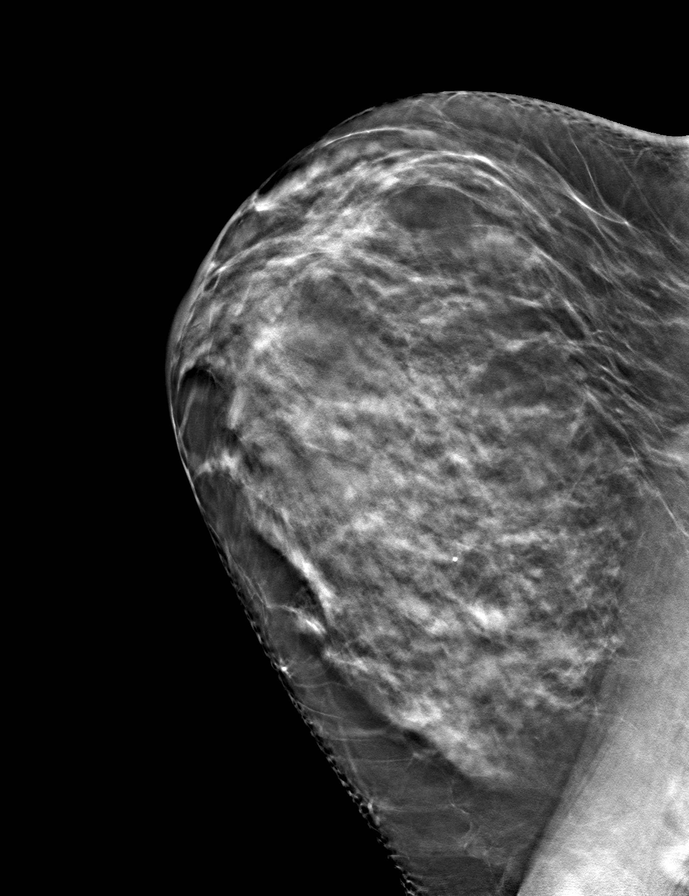

[R CC tomo · tomo slice 39/78.0]
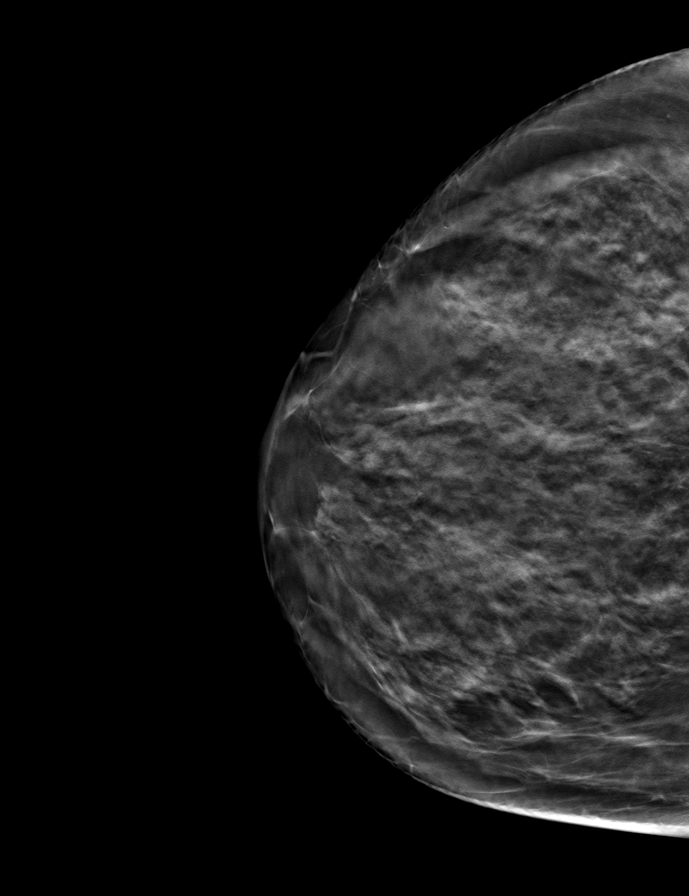

[9 of 24 positions shown; findings below may reference images not displayed]

ACR Breast Density Category c: The breast tissue is heterogeneously
dense, which may obscure small masses.
FINDINGS: There are no findings suspicious for malignancy. Images were
processed with CAD.
IMPRESSION: No mammographic evidence of malignancy. A result letter of this
screening mammogram will be mailed directly to the patient.

RECOMMENDATION:
Screening mammogram in one year. (Code:[SM])

BI-RADS CATEGORY  1: Negative.

## 2016-12-10 DIAGNOSIS — H02831 Dermatochalasis of right upper eyelid: Secondary | ICD-10-CM | POA: Diagnosis not present

## 2016-12-10 DIAGNOSIS — H5213 Myopia, bilateral: Secondary | ICD-10-CM | POA: Diagnosis not present

## 2016-12-10 DIAGNOSIS — H02834 Dermatochalasis of left upper eyelid: Secondary | ICD-10-CM | POA: Diagnosis not present

## 2016-12-14 HISTORY — PX: BLEPHAROPLASTY: SUR158

## 2017-01-13 DIAGNOSIS — D1801 Hemangioma of skin and subcutaneous tissue: Secondary | ICD-10-CM | POA: Diagnosis not present

## 2017-01-13 DIAGNOSIS — K130 Diseases of lips: Secondary | ICD-10-CM | POA: Diagnosis not present

## 2017-01-13 DIAGNOSIS — L821 Other seborrheic keratosis: Secondary | ICD-10-CM | POA: Diagnosis not present

## 2017-01-13 DIAGNOSIS — Z85828 Personal history of other malignant neoplasm of skin: Secondary | ICD-10-CM | POA: Diagnosis not present

## 2017-01-13 DIAGNOSIS — L570 Actinic keratosis: Secondary | ICD-10-CM | POA: Diagnosis not present

## 2017-07-30 DIAGNOSIS — J3489 Other specified disorders of nose and nasal sinuses: Secondary | ICD-10-CM | POA: Diagnosis not present

## 2017-08-12 DIAGNOSIS — J3489 Other specified disorders of nose and nasal sinuses: Secondary | ICD-10-CM | POA: Diagnosis not present

## 2017-08-23 DIAGNOSIS — H02834 Dermatochalasis of left upper eyelid: Secondary | ICD-10-CM | POA: Diagnosis not present

## 2017-08-23 DIAGNOSIS — H02413 Mechanical ptosis of bilateral eyelids: Secondary | ICD-10-CM | POA: Diagnosis not present

## 2017-08-23 DIAGNOSIS — H02423 Myogenic ptosis of bilateral eyelids: Secondary | ICD-10-CM | POA: Diagnosis not present

## 2017-08-23 DIAGNOSIS — H0279 Other degenerative disorders of eyelid and periocular area: Secondary | ICD-10-CM | POA: Diagnosis not present

## 2017-08-23 DIAGNOSIS — H02831 Dermatochalasis of right upper eyelid: Secondary | ICD-10-CM | POA: Diagnosis not present

## 2018-08-20 ENCOUNTER — Encounter (HOSPITAL_BASED_OUTPATIENT_CLINIC_OR_DEPARTMENT_OTHER): Payer: Self-pay | Admitting: Emergency Medicine

## 2018-08-20 ENCOUNTER — Emergency Department (HOSPITAL_BASED_OUTPATIENT_CLINIC_OR_DEPARTMENT_OTHER)
Admission: EM | Admit: 2018-08-20 | Discharge: 2018-08-20 | Disposition: A | Discharge status: Home / Self Care | Payer: BLUE CROSS/BLUE SHIELD | Attending: Emergency Medicine | Admitting: Emergency Medicine

## 2018-08-20 ENCOUNTER — Emergency Department (HOSPITAL_BASED_OUTPATIENT_CLINIC_OR_DEPARTMENT_OTHER): Payer: BLUE CROSS/BLUE SHIELD

## 2018-08-20 ENCOUNTER — Other Ambulatory Visit: Payer: Self-pay

## 2018-08-20 DIAGNOSIS — Y999 Unspecified external cause status: Secondary | ICD-10-CM | POA: Diagnosis not present

## 2018-08-20 DIAGNOSIS — Y939 Activity, unspecified: Secondary | ICD-10-CM | POA: Insufficient documentation

## 2018-08-20 DIAGNOSIS — S93492A Sprain of other ligament of left ankle, initial encounter: Secondary | ICD-10-CM

## 2018-08-20 DIAGNOSIS — X509XXA Other and unspecified overexertion or strenuous movements or postures, initial encounter: Secondary | ICD-10-CM | POA: Diagnosis not present

## 2018-08-20 DIAGNOSIS — S99912A Unspecified injury of left ankle, initial encounter: Secondary | ICD-10-CM | POA: Diagnosis present

## 2018-08-20 DIAGNOSIS — Y929 Unspecified place or not applicable: Secondary | ICD-10-CM | POA: Diagnosis not present

## 2018-08-20 DIAGNOSIS — S93402A Sprain of unspecified ligament of left ankle, initial encounter: Secondary | ICD-10-CM | POA: Diagnosis not present

## 2018-08-20 IMAGING — DX DG ANKLE COMPLETE 3+V*L*
3 series · 3 of 3 positions shown · non-contrast
Comparison: None.

CLINICAL DATA: 56-year-old female with a history of fall

EXAM:
LEFT ANKLE COMPLETE - 3+ VIEW

[ankle ap]
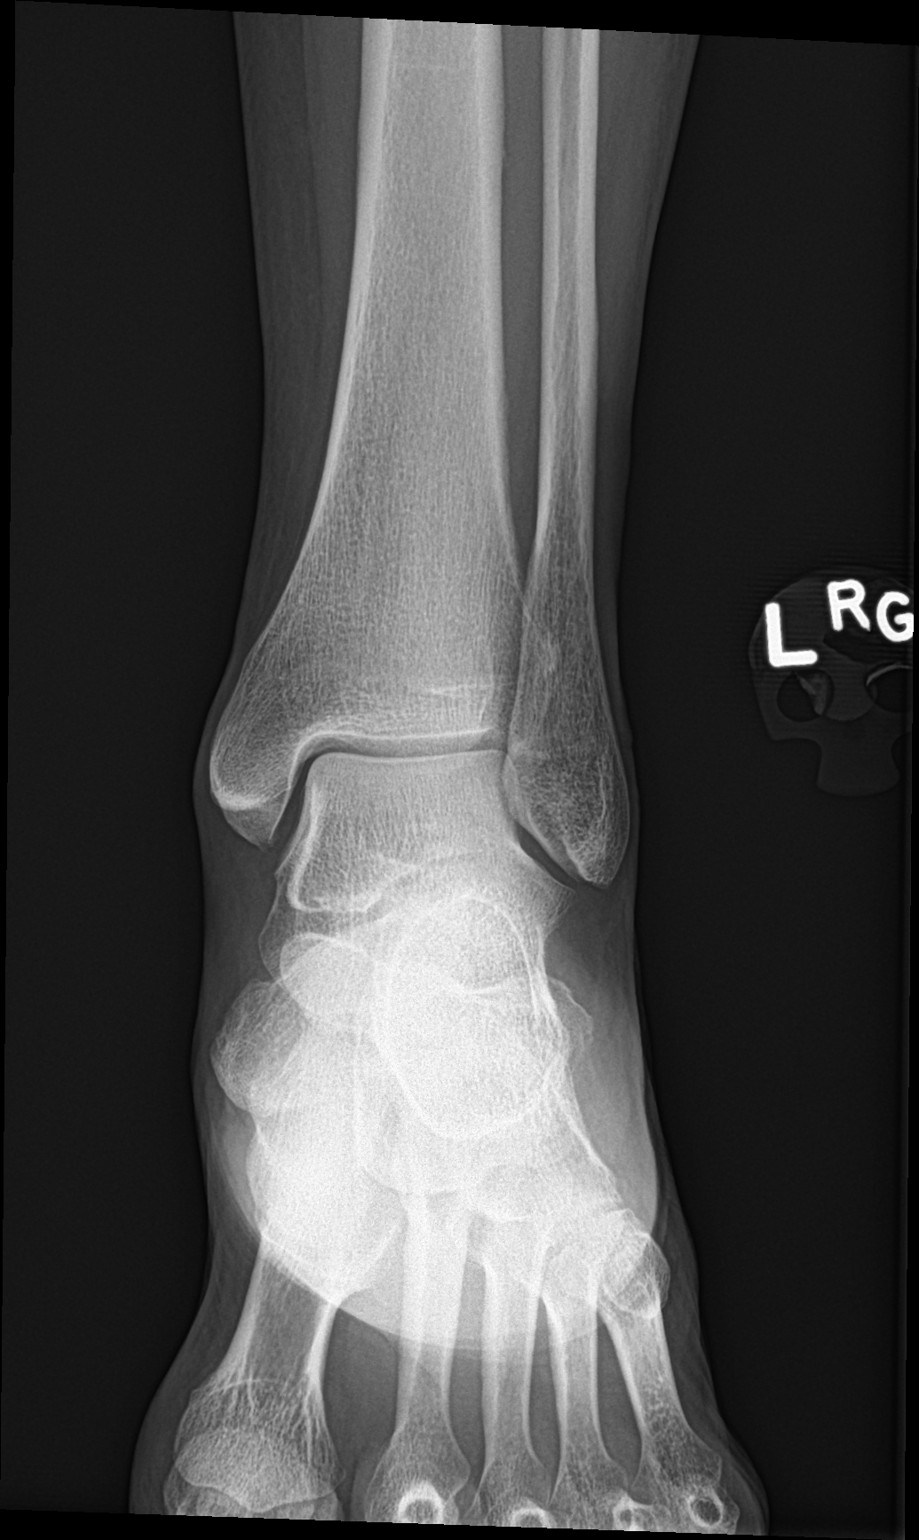

[ankle obl]
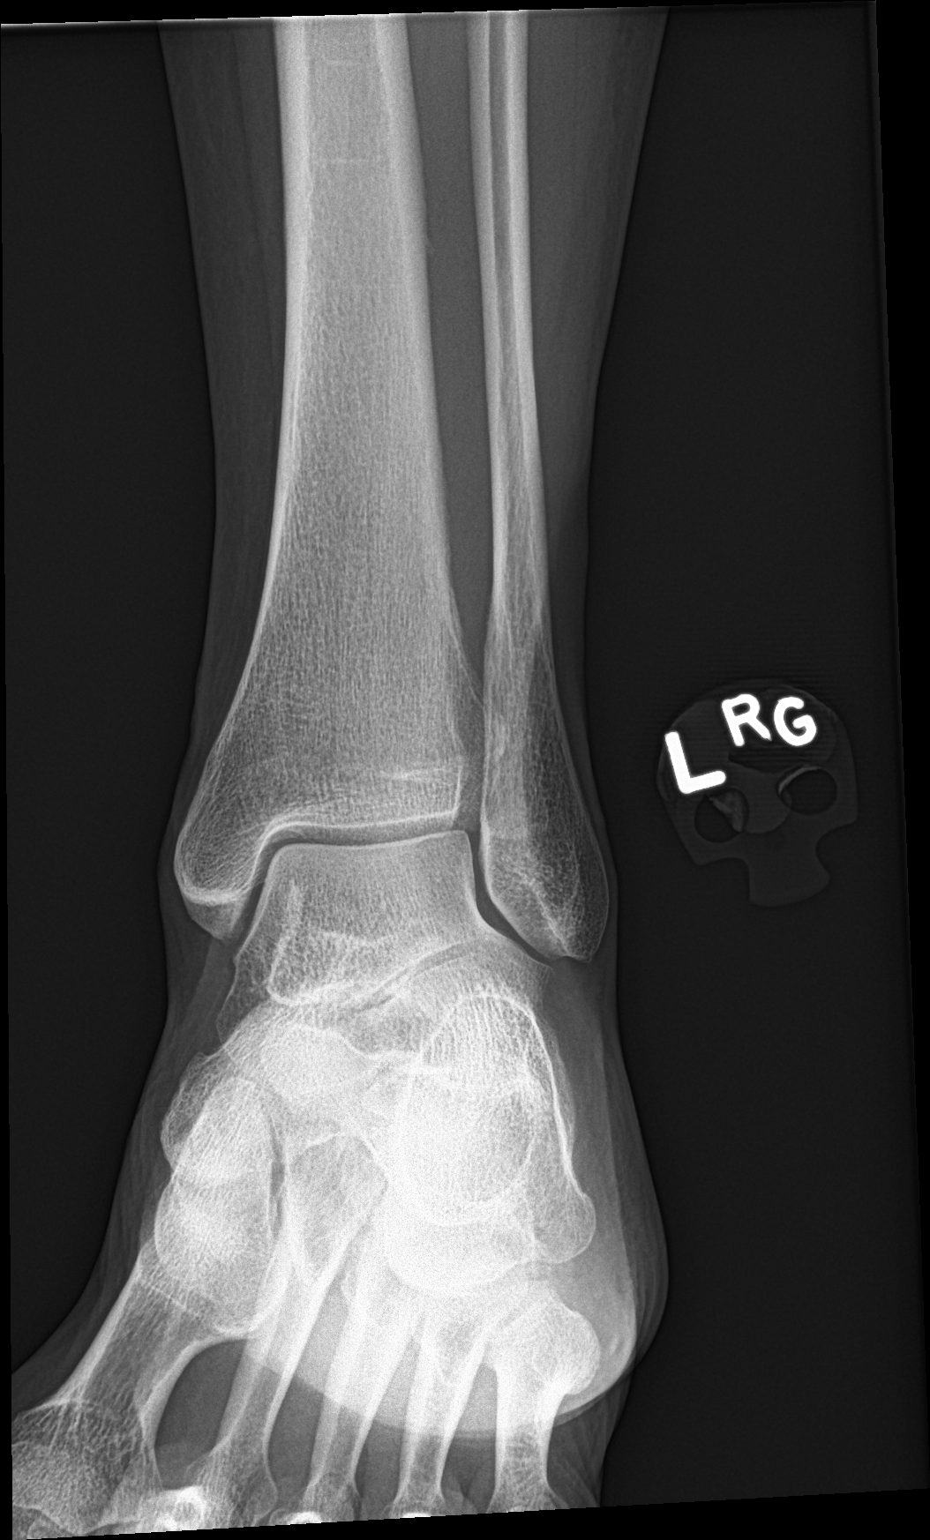

[ankle lat]
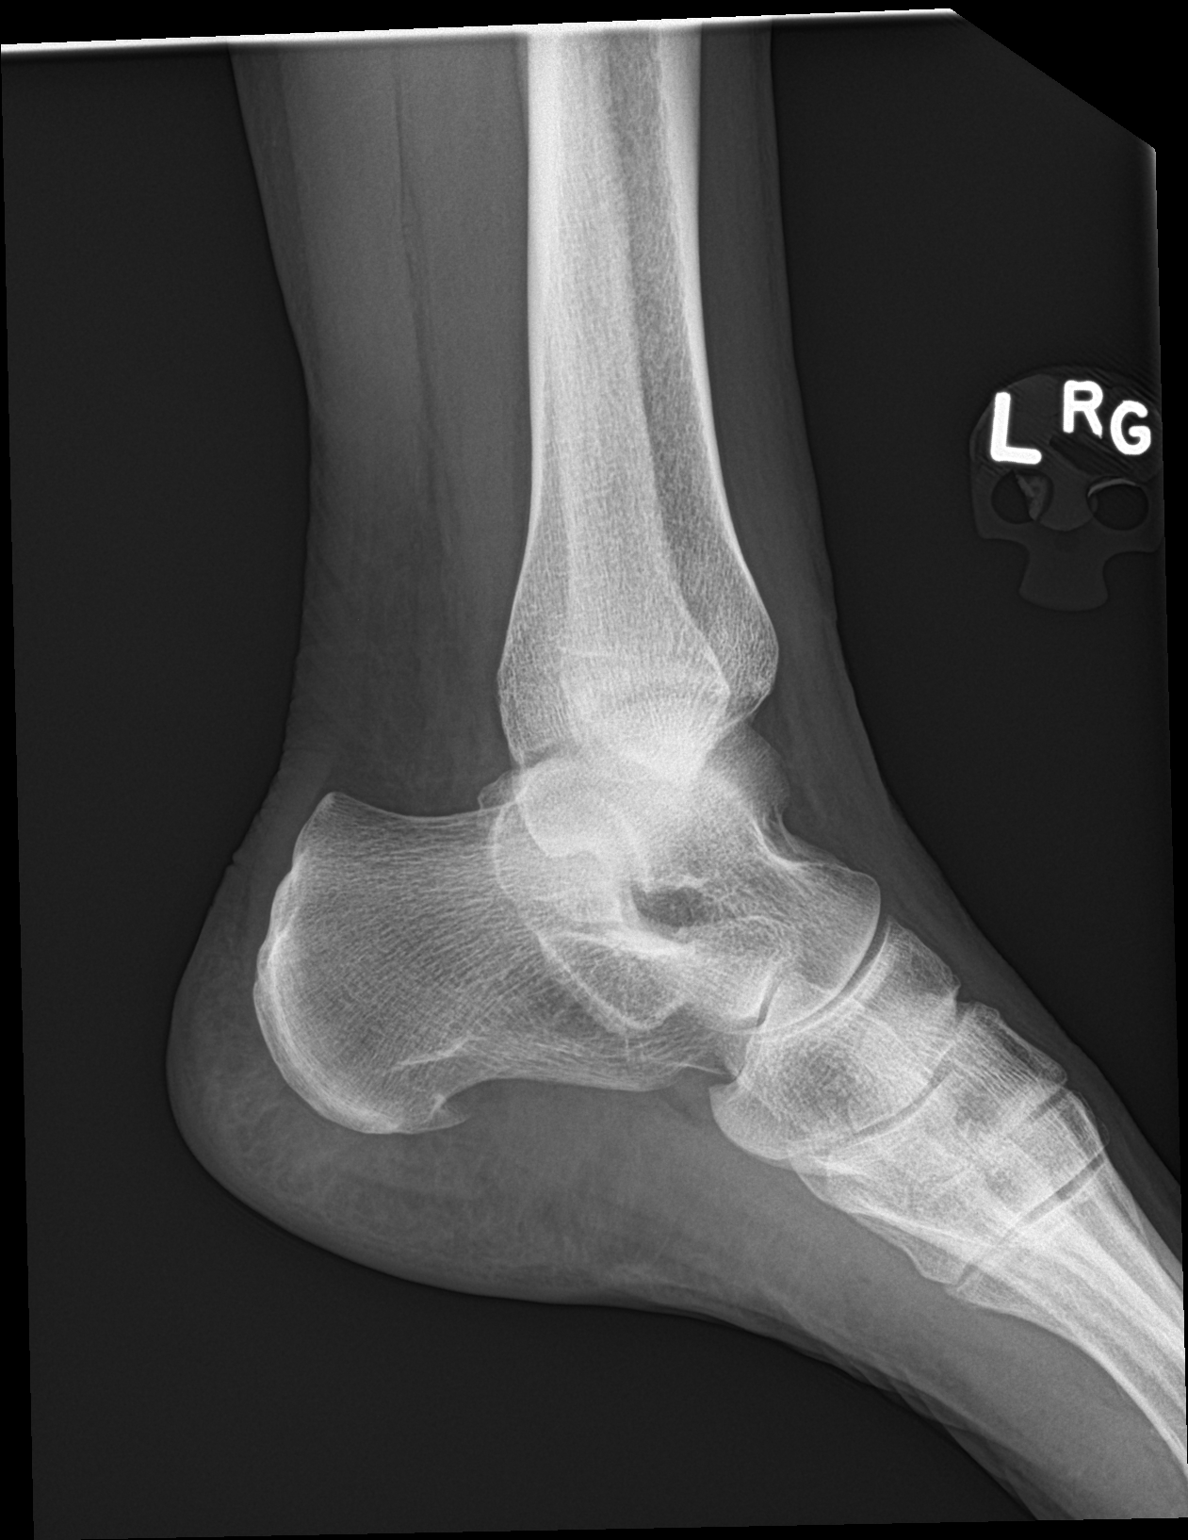

[3 of 3 positions shown; findings below may reference images not displayed]

FINDINGS: There is no evidence of fracture, dislocation, or joint effusion.
There is no evidence of arthropathy or other focal bone abnormality.
Soft tissues are unremarkable.
IMPRESSION: No acute bony abnormality

## 2018-08-20 MED ORDER — NAPROXEN 500 MG PO TABS: 500.0000 mg | ORAL_TABLET | Freq: Two times a day (BID) | ORAL | 0 refills | Status: DC

## 2018-08-20 NOTE — ED Provider Notes (Signed)
[facility] EMERGENCY DEPARTMENT Provider Note   CSN: [id number] Arrival date & time: 08/20/18  3646     History   Chief Complaint Chief Complaint  Patient presents with  . Ankle Pain    HPI Lisa Kelley is a 57 y.o. female.  HPI   57 year old female presents with concern for left ankle pain.  Patient reports she was getting up from the dinner table last night, and twisted her ankle with pain.  Reports she has been bearing weight on it, however with pain.  She took ibuprofen last night with continued pain, and took Tylenol this morning with some improvement.  Reports the pain is located in the lateral area of her ankle.  Denies any other areas of injury, numbness or other concerns.  History reviewed. No pertinent past medical history.  There are no active problems to display for this patient.   Past Surgical History:  Procedure Laterality Date  . CESAREAN SECTION    . MYOMECTOMY    . TONSILLECTOMY       OB History   None      Home Medications    Prior to Admission medications   Medication Sig Start Date End Date Taking? Authorizing Provider  naproxen (NAPROSYN) 500 MG tablet Take 1 tablet (500 mg total) by mouth 2 (two) times daily. 08/20/18   Gareth Morgan, MD    Family History No family history on file.  Social History Social History   Tobacco Use  . Smoking status: Never Smoker  . Smokeless tobacco: Never Used  Substance Use Topics  . Alcohol use: Yes  . Drug use: Never     Allergies   Patient has no known allergies.   Review of Systems Review of Systems  Constitutional: Negative for fever.  Gastrointestinal: Negative for abdominal pain.  Musculoskeletal: Positive for arthralgias and gait problem.  Skin: Negative for rash and wound.     Physical Exam Updated Vital Signs BP (!) 173/89 (BP Location: Left Arm)   Pulse 100   Temp 99 F (37.2 C) (Oral)   Resp 16   Ht 5\' 8"  (1.727 m)   Wt 64.4 kg   SpO2 100%   BMI 21.59  kg/m   Physical Exam  Constitutional: She is oriented to person, place, and time. She appears well-developed and well-nourished. No distress.  HENT:  Head: Normocephalic and atraumatic.  Eyes: Conjunctivae and EOM are normal.  Neck: Normal range of motion.  Cardiovascular: Normal rate, regular rhythm and intact distal pulses.  Pulmonary/Chest: Effort normal and breath sounds normal. No respiratory distress.  Abdominal: She exhibits no distension.  Musculoskeletal: She exhibits no edema.       Right ankle: She exhibits normal range of motion, no swelling and no deformity. No tenderness. No lateral malleolus, no medial malleolus and no AITFL tenderness found.       Left ankle: She exhibits swelling. She exhibits no deformity, no laceration and normal pulse. Tenderness. AITFL tenderness found. No lateral malleolus, no medial malleolus, no posterior TFL, no head of 5th metatarsal and no proximal fibula tenderness found.  Neurological: She is alert and oriented to person, place, and time.  Skin: Skin is warm and dry. No rash noted. She is not diaphoretic. No erythema.  Nursing note and vitals reviewed.    ED Treatments / Results  Labs (all labs ordered are listed, but only abnormal results are displayed) Labs Reviewed - No data to display  EKG None  Radiology Dg Ankle Complete  Left  Result Date: 08/20/2018 CLINICAL DATA:  57 year old female with a history of fall EXAM: LEFT ANKLE COMPLETE - 3+ VIEW COMPARISON:  None. FINDINGS: There is no evidence of fracture, dislocation, or joint effusion. There is no evidence of arthropathy or other focal bone abnormality. Soft tissues are unremarkable. IMPRESSION: No acute bony abnormality Electronically Signed   By: Corrie Mckusick D.O.   On: 08/20/2018 10:22    Procedures Procedures (including critical care time)  Medications Ordered in ED Medications - No data to display   Initial Impression / Assessment and Plan / ED Course  I have reviewed  the triage vital signs and the nursing notes.  Pertinent labs & imaging results that were available during my care of the patient were reviewed by me and considered in my medical decision making (see chart for details).     57yo female presents with ankle pain after twisting injury. XR negative. NV intact. No other signs of injury by history or exam. Likely ATFL sprain. Recommend naproxen, tylenol, ice, elevation, weight bearing as tolerated and outpt follow up. Patient discharged in stable condition with understanding of reasons to return.   Final Clinical Impressions(s) / ED Diagnoses   Final diagnoses:  Sprain of anterior talofibular ligament of left ankle, initial encounter    ED Discharge Orders         Ordered    naproxen (NAPROSYN) 500 MG tablet  2 times daily     08/20/18 1037           Gareth Morgan, MD 08/20/18 1041

## 2018-08-20 NOTE — Discharge Instructions (Signed)
Take Tylenol 1000 mg 4 times a day for 1 week. This is the maximum dose of Tylenol (acetaminophen) you may take from all sources. Please check other over-the-counter medications and prescriptions to ensure you are not taking other medications that contain acetaminophen.  You may also take naproxen twice daily.

## 2018-08-20 NOTE — ED Triage Notes (Signed)
L ankle pain after twisting it last night.

## 2018-08-20 NOTE — ED Notes (Signed)
NAD at this time. Pt is stable and going home.  

## 2019-02-14 DIAGNOSIS — H524 Presbyopia: Secondary | ICD-10-CM | POA: Diagnosis not present

## 2019-02-14 DIAGNOSIS — H52203 Unspecified astigmatism, bilateral: Secondary | ICD-10-CM | POA: Diagnosis not present

## 2019-02-14 DIAGNOSIS — H5213 Myopia, bilateral: Secondary | ICD-10-CM | POA: Diagnosis not present

## 2019-04-13 DIAGNOSIS — D1801 Hemangioma of skin and subcutaneous tissue: Secondary | ICD-10-CM | POA: Diagnosis not present

## 2019-04-13 DIAGNOSIS — Z85828 Personal history of other malignant neoplasm of skin: Secondary | ICD-10-CM | POA: Diagnosis not present

## 2019-04-13 DIAGNOSIS — L821 Other seborrheic keratosis: Secondary | ICD-10-CM | POA: Diagnosis not present

## 2019-04-13 DIAGNOSIS — L814 Other melanin hyperpigmentation: Secondary | ICD-10-CM | POA: Diagnosis not present

## 2019-04-13 DIAGNOSIS — L570 Actinic keratosis: Secondary | ICD-10-CM | POA: Diagnosis not present

## 2019-04-13 DIAGNOSIS — L304 Erythema intertrigo: Secondary | ICD-10-CM | POA: Diagnosis not present

## 2019-04-13 DIAGNOSIS — L820 Inflamed seborrheic keratosis: Secondary | ICD-10-CM | POA: Diagnosis not present

## 2019-04-22 DIAGNOSIS — L237 Allergic contact dermatitis due to plants, except food: Secondary | ICD-10-CM | POA: Diagnosis not present

## 2019-06-15 DIAGNOSIS — L570 Actinic keratosis: Secondary | ICD-10-CM | POA: Diagnosis not present

## 2019-06-15 DIAGNOSIS — L820 Inflamed seborrheic keratosis: Secondary | ICD-10-CM | POA: Diagnosis not present

## 2019-06-15 DIAGNOSIS — Z85828 Personal history of other malignant neoplasm of skin: Secondary | ICD-10-CM | POA: Diagnosis not present

## 2019-08-15 DIAGNOSIS — R030 Elevated blood-pressure reading, without diagnosis of hypertension: Secondary | ICD-10-CM | POA: Diagnosis not present

## 2019-08-15 DIAGNOSIS — B86 Scabies: Secondary | ICD-10-CM | POA: Diagnosis not present

## 2020-06-06 DIAGNOSIS — L821 Other seborrheic keratosis: Secondary | ICD-10-CM | POA: Diagnosis not present

## 2020-06-06 DIAGNOSIS — L570 Actinic keratosis: Secondary | ICD-10-CM | POA: Diagnosis not present

## 2020-06-06 DIAGNOSIS — D1801 Hemangioma of skin and subcutaneous tissue: Secondary | ICD-10-CM | POA: Diagnosis not present

## 2020-06-06 DIAGNOSIS — L720 Epidermal cyst: Secondary | ICD-10-CM | POA: Diagnosis not present

## 2020-06-06 DIAGNOSIS — D2239 Melanocytic nevi of other parts of face: Secondary | ICD-10-CM | POA: Diagnosis not present

## 2020-06-06 DIAGNOSIS — L814 Other melanin hyperpigmentation: Secondary | ICD-10-CM | POA: Diagnosis not present

## 2020-06-06 DIAGNOSIS — K130 Diseases of lips: Secondary | ICD-10-CM | POA: Diagnosis not present

## 2020-06-06 DIAGNOSIS — Z85828 Personal history of other malignant neoplasm of skin: Secondary | ICD-10-CM | POA: Diagnosis not present

## 2020-08-06 ENCOUNTER — Other Ambulatory Visit: Payer: Self-pay

## 2020-08-06 ENCOUNTER — Emergency Department
Admission: EM | Admit: 2020-08-06 | Discharge: 2020-08-06 | Disposition: A | Discharge status: Home / Self Care | Payer: 59 | Source: Home / Self Care

## 2020-08-06 DIAGNOSIS — Z01810 Encounter for preprocedural cardiovascular examination: Secondary | ICD-10-CM

## 2020-08-06 DIAGNOSIS — R002 Palpitations: Secondary | ICD-10-CM | POA: Diagnosis not present

## 2020-08-06 NOTE — ED Provider Notes (Signed)
[facility]    CSN: [id number] Arrival date & time: 08/06/20  1347      History   Chief Complaint Chief Complaint  Patient presents with  . Palpitations    HPI Lisa Kelley is a 59 y.o. female.   Complaint of palpitations for about the last 5 days.  She works as a Marine scientist at another facility.  She noted PVC on rhythm strip.  Also had some tachycardia to about 170.  She denies any chest pain.  There is no weight loss suggesting thyroid disorder.  She is scheduled for breast surgery in the coming month.  She denies increased anxiety or other stimulants other than minimal caffeine.  There is no underlying heart or lung disease the patient is aware.  She does not have a primary care physician.  HPI  History reviewed. No pertinent past medical history.  There are no problems to display for this patient.   Past Surgical History:  Procedure Laterality Date  . CESAREAN SECTION    . MYOMECTOMY    . TONSILLECTOMY      OB History   No obstetric history on file.      Home Medications    Prior to Admission medications   Medication Sig Start Date End Date Taking? Authorizing Provider  naproxen (NAPROSYN) 500 MG tablet Take 1 tablet (500 mg total) by mouth 2 (two) times daily. 08/20/18   Gareth Morgan, MD    Family History Family History  Problem Relation Age of Onset  . Heart failure Mother   . Hypertension Mother   . Heart attack Mother   . Diabetes Mother   . Stroke Mother   . Cancer Mother        colon  . Cancer Father        pancreatic    Social History Social History   Tobacco Use  . Smoking status: Never Smoker  . Smokeless tobacco: Never Used  Substance Use Topics  . Alcohol use: Yes    Alcohol/week: 2.0 standard drinks    Types: 2 Glasses of wine per week    Comment: socially  . Drug use: Never     Allergies   Patient has no known allergies.   Review of Systems Review of Systems  Cardiovascular: Positive for palpitations.  All  other systems reviewed and are negative.    Physical Exam Triage Vital Signs ED Triage Vitals  Enc Vitals Group     BP 08/06/20 1411 (!) 165/97     Pulse Rate 08/06/20 1411 94     Resp 08/06/20 1411 16     Temp 08/06/20 1411 98.8 F (37.1 C)     Temp Source 08/06/20 1411 Oral     SpO2 08/06/20 1411 100 %     Weight --      Height --      Head Circumference --      Peak Flow --      Pain Score 08/06/20 1406 0     Pain Loc --      Pain Edu? --      Excl. in [place]? --    No data found.  Updated Vital Signs BP (!) 165/97 (BP Location: Left Arm)   Pulse 94   Temp 98.8 F (37.1 C) (Oral)   Resp 16   SpO2 100%   Visual Acuity Right Eye Distance:   Left Eye Distance:   Bilateral Distance:    Right Eye Near:   Left Eye  Near:    Bilateral Near:     Physical Exam Vitals and nursing note reviewed.  Constitutional:      Appearance: Normal appearance.  HENT:     Head: Normocephalic.     Mouth/Throat:     Mouth: Mucous membranes are moist.  Neck:     Vascular: No carotid bruit.  Cardiovascular:     Rate and Rhythm: Normal rate and regular rhythm.  Pulmonary:     Effort: Pulmonary effort is normal.     Breath sounds: Normal breath sounds.  Musculoskeletal:     Cervical back: Normal range of motion. No tenderness.  Neurological:     General: No focal deficit present.     Mental Status: She is alert and oriented to person, place, and time.      UC Treatments / Results  Labs (all labs ordered are listed, but only abnormal results are displayed) Labs Reviewed  TSH  T4  T3    EKG EKG shows occasional PVC 9 pairs or runs.  No other acute ischemic changes noted  Radiology No results found.  Procedures Procedures (including critical care time)  Medications Ordered in UC Medications - No data to display  Initial Impression / Assessment and Plan / UC Course  I have reviewed the triage vital signs and the nursing notes.  Pertinent labs & imaging results  that were available during my care of the patient were reviewed by me and considered in my medical decision making (see chart for details).     Palpitation related to PVC.  Would like to refer her to cardiology for further evaluation possibly stress test and/or echo.  Will check thyroid functions while she is here today. Final Clinical Impressions(s) / UC Diagnoses   Final diagnoses:  Palpitations  Pre-operative cardiovascular examination   Discharge Instructions   None    ED Prescriptions    None     PDMP not reviewed this encounter.   Wardell Honour, MD 08/06/20 1534

## 2020-08-06 NOTE — ED Triage Notes (Signed)
Patient presents to Urgent Care with complaints of palpitations since about 5 days ago. Patient reports she thinks she might be having PVCs, does not feel like she has been symptomatic. 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 Hooked herself up to an EKG and noticed she was having PVCs while she was sitting, then became tachycardic when she stood shortly afterwards. Pt was hoping it would go away but it has not. also has intermittent posterior tension headache, which is not normal for her.

## 2020-08-07 ENCOUNTER — Ambulatory Visit: Payer: Self-pay

## 2020-08-07 DIAGNOSIS — Z1231 Encounter for screening mammogram for malignant neoplasm of breast: Secondary | ICD-10-CM | POA: Diagnosis not present

## 2020-08-07 LAB — TSH: TSH: 2.42 mIU/L (ref 0.40–4.50)

## 2020-08-07 LAB — T4: T4, Total: 6.7 ug/dL (ref 5.1–11.9)

## 2020-08-07 LAB — HM MAMMOGRAPHY

## 2020-08-07 LAB — T3: T3, Total: 117 ng/dL (ref 76–181)

## 2020-08-10 ENCOUNTER — Telehealth: Payer: Self-pay | Admitting: Emergency Medicine

## 2020-08-14 ENCOUNTER — Other Ambulatory Visit: Payer: Self-pay

## 2020-08-14 ENCOUNTER — Encounter (HOSPITAL_BASED_OUTPATIENT_CLINIC_OR_DEPARTMENT_OTHER): Payer: Self-pay | Admitting: Emergency Medicine

## 2020-08-14 ENCOUNTER — Emergency Department (HOSPITAL_BASED_OUTPATIENT_CLINIC_OR_DEPARTMENT_OTHER)
Admission: EM | Admit: 2020-08-14 | Discharge: 2020-08-14 | Disposition: A | Discharge status: Home / Self Care | Payer: 59 | Attending: Emergency Medicine | Admitting: Emergency Medicine

## 2020-08-14 DIAGNOSIS — Z79899 Other long term (current) drug therapy: Secondary | ICD-10-CM | POA: Diagnosis not present

## 2020-08-14 DIAGNOSIS — R21 Rash and other nonspecific skin eruption: Secondary | ICD-10-CM | POA: Diagnosis not present

## 2020-08-14 DIAGNOSIS — R519 Headache, unspecified: Secondary | ICD-10-CM | POA: Diagnosis not present

## 2020-08-14 DIAGNOSIS — Z20822 Contact with and (suspected) exposure to covid-19: Secondary | ICD-10-CM | POA: Insufficient documentation

## 2020-08-14 DIAGNOSIS — R002 Palpitations: Secondary | ICD-10-CM | POA: Diagnosis not present

## 2020-08-14 LAB — SARS CORONAVIRUS 2 BY RT PCR (HOSPITAL ORDER, PERFORMED IN ~~LOC~~ HOSPITAL LAB): SARS Coronavirus 2: NEGATIVE

## 2020-08-14 LAB — C-REACTIVE PROTEIN: CRP: 0.5 mg/dL (ref <1.0)

## 2020-08-14 LAB — CBC WITH DIFFERENTIAL/PLATELET
Abs Immature Granulocytes: 0.04 10*3/uL (ref 0.00–0.07)
Basophils Absolute: 0 10*3/uL (ref 0.0–0.1)
Basophils Relative: 0 %
Eosinophils Absolute: 0.1 10*3/uL (ref 0.0–0.5)
Eosinophils Relative: 1 %
HCT: 40 % (ref 36.0–46.0)
Hemoglobin: 13.4 g/dL (ref 12.0–15.0)
Immature Granulocytes: 1 %
Lymphocytes Relative: 10 %
Lymphs Abs: 0.7 10*3/uL (ref 0.7–4.0)
MCH: 30.2 pg (ref 26.0–34.0)
MCHC: 33.5 g/dL (ref 30.0–36.0)
MCV: 90.1 fL (ref 80.0–100.0)
Monocytes Absolute: 0.3 10*3/uL (ref 0.1–1.0)
Monocytes Relative: 5 %
Neutro Abs: 5.6 10*3/uL (ref 1.7–7.7)
Neutrophils Relative %: 83 %
Platelets: 297 10*3/uL (ref 150–400)
RBC: 4.44 MIL/uL (ref 3.87–5.11)
RDW: 11.9 % (ref 11.5–15.5)
WBC: 6.8 10*3/uL (ref 4.0–10.5)
nRBC: 0 % (ref 0.0–0.2)

## 2020-08-14 LAB — BASIC METABOLIC PANEL
Anion gap: 12 (ref 5–15)
BUN: 7 mg/dL (ref 6–20)
CO2: 25 mmol/L (ref 22–32)
Calcium: 9.4 mg/dL (ref 8.9–10.3)
Chloride: 104 mmol/L (ref 98–111)
Creatinine, Ser: 0.65 mg/dL (ref 0.44–1.00)
GFR calc Af Amer: >60 mL/min (ref >60)
GFR calc non Af Amer: >60 mL/min (ref >60)
Glucose, Bld: 140 mg/dL — ABNORMAL HIGH (ref 70–99)
Potassium: 3.4 mmol/L — ABNORMAL LOW (ref 3.5–5.1)
Sodium: 141 mmol/L (ref 135–145)

## 2020-08-14 LAB — MAGNESIUM: Magnesium: 2.1 mg/dL (ref 1.7–2.4)

## 2020-08-14 LAB — SEDIMENTATION RATE: Sed Rate: 5 mm/hr (ref 0–22)

## 2020-08-14 MED ORDER — POTASSIUM CHLORIDE CRYS ER 20 MEQ PO TBCR
40.0000 meq | EXTENDED_RELEASE_TABLET | Freq: Once | ORAL | Status: AC
  Administered 2020-08-14: 40 meq via ORAL
  Filled 2020-08-14: qty 2

## 2020-08-14 MED ORDER — DOXYCYCLINE HYCLATE 100 MG PO CAPS: 100.0000 mg | ORAL_CAPSULE | Freq: Two times a day (BID) | ORAL | 0 refills | Status: AC

## 2020-08-14 MED ORDER — METOCLOPRAMIDE HCL 5 MG/ML IJ SOLN
10.0000 mg | Freq: Once | INTRAMUSCULAR | Status: AC
  Administered 2020-08-14: 10 mg via INTRAVENOUS
  Filled 2020-08-14: qty 2

## 2020-08-14 MED ORDER — SODIUM CHLORIDE 0.9 % IV BOLUS
1000.0000 mL | Freq: Once | INTRAVENOUS | Status: AC
  Administered 2020-08-14: 1000 mL via INTRAVENOUS

## 2020-08-14 MED ORDER — DIPHENHYDRAMINE HCL 50 MG/ML IJ SOLN
12.5000 mg | Freq: Once | INTRAMUSCULAR | Status: AC
  Administered 2020-08-14: 12.5 mg via INTRAVENOUS
  Filled 2020-08-14: qty 1

## 2020-08-14 MED ORDER — METOPROLOL SUCCINATE ER 25 MG PO TB24: 12.5000 mg | ORAL_TABLET | Freq: Every day | ORAL | 0 refills | Status: DC

## 2020-08-14 NOTE — ED Notes (Signed)
ED Provider at bedside. 

## 2020-08-14 NOTE — ED Provider Notes (Signed)
[facility] EMERGENCY DEPARTMENT Provider Note   CSN: [id number] Arrival date & time: 08/14/20  1004     History Chief Complaint  Patient presents with  . Headache    Lisa Kelley is a 59 y.o. female presenting to the ED with complaint of headache and palpitations that began a few days ago. 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 She states while at work she has noticed lightheadedness and intermittent palpitations. She states she evaluated her palpitations on the cardiac monitor and noticed some PVCs as well as a rhythm with fast rate into 170-180. She provides a video on her iphone of the rhythm. She states symptoms mostly occur when she is seated and are not exertional. She states she also has noticed some puffiness to her face and a redness to her cheeks when walking outside in the heat. She also feels as though her lymph nodes are swollen in her neck. She has intermittent posterior headache at the base of her head over the last few days that she has treated with tylenol and advil with minimal relief. She denies any recent dietary changes. She denies associated chest pain, shortness of breath, photophobia, nausea, vision changes, fever, other neurologic symptoms.    The history is provided by the patient.       No past medical history on file.  There are no problems to display for this patient.   Past Surgical History:  Procedure Laterality Date  . CESAREAN SECTION    . FACIAL COSMETIC SURGERY    . MYOMECTOMY    . TONSILLECTOMY       OB History   No obstetric history on file.     Family History  Problem Relation Age of Onset  . Heart failure Mother   . Hypertension Mother   . Heart attack Mother   . Diabetes Mother   . Stroke Mother   . Cancer Mother        colon  . Cancer Father        pancreatic    Social History   Tobacco Use  . Smoking status: Never Smoker  . Smokeless tobacco: Never Used  Substance Use Topics  . Alcohol  use: Yes    Alcohol/week: 2.0 standard drinks    Types: 2 Glasses of wine per week    Comment: socially  . Drug use: Never    Home Medications Prior to Admission medications   Medication Sig Start Date End Date Taking? Authorizing Provider  doxycycline (VIBRAMYCIN) 100 MG capsule Take 1 capsule (100 mg total) by mouth 2 (two) times daily for 7 days. 08/14/20 08/21/20  Seletha Zimmermann, [place] N, PA-C  metoprolol succinate (TOPROL-XL) 25 MG 24 hr tablet Take 0.5 tablets (12.5 mg total) by mouth daily. 08/14/20 09/13/20  Arnold Kester, [place] N, PA-C  naproxen (NAPROSYN) 500 MG tablet Take 1 tablet (500 mg total) by mouth 2 (two) times daily. 08/20/18   Gareth Morgan, MD    Allergies    Patient has no known allergies.  Review of Systems   Review of Systems  All other systems reviewed and are negative.   Physical Exam Updated Vital Signs BP 110/73 (BP Location: Right Arm)   Pulse 67   Temp 99.1 F (37.3 C) (Oral)   Resp 18   Ht 5' 8"  (1.727 m)   Wt 63.5 kg   SpO2 99%   BMI 21.29 kg/m   Physical Exam Vitals and nursing note reviewed.  Constitutional:  General: She is not in acute distress.    Appearance: She is well-developed. She is not ill-appearing.  HENT:     Head: Normocephalic and atraumatic.  Eyes:     Extraocular Movements: Extraocular movements intact.     Conjunctiva/sclera: Conjunctivae normal.     Pupils: Pupils are equal, round, and reactive to light.  Cardiovascular:     Rate and Rhythm: Normal rate and regular rhythm.  Pulmonary:     Effort: Pulmonary effort is normal. No respiratory distress.     Breath sounds: Normal breath sounds.  Abdominal:     General: Bowel sounds are normal.     Palpations: Abdomen is soft.     Tenderness: There is no abdominal tenderness.  Musculoskeletal:     Cervical back: Normal range of motion and neck supple.  Lymphadenopathy:     Cervical: No cervical adenopathy.  Skin:    General: Skin is warm.  Neurological:     Mental  Status: She is alert.     Comments: CN grossly intact. Speech is fluent without aphasia. Spontaneously moving all extremities with normal tone and coordination.  Psychiatric:        Behavior: Behavior normal.     Photo of patient's video while on cardiac monitor during episode of palpitations.  ED Results / Procedures / Treatments   Labs (all labs ordered are listed, but only abnormal results are displayed) Labs Reviewed  BASIC METABOLIC PANEL - Abnormal; Notable for the following components:      Result Value   Potassium 3.4 (*)    Glucose, Bld 140 (*)    All other components within normal limits  SARS CORONAVIRUS 2 BY RT PCR (HOSPITAL ORDER, [facility])  CBC WITH DIFFERENTIAL/PLATELET  MAGNESIUM  SEDIMENTATION RATE  ANTINUCLEAR ANTIBODIES, IFA  LUPUS ANTICOAGULANT PANEL  RHEUMATOID FACTOR  C-REACTIVE PROTEIN  ROCKY MTN SPOTTED FVR ABS PNL(IGG+IGM)    EKG EKG Interpretation  Date/Time:  Wednesday August 14 2020 12:57:31 EDT Ventricular Rate:  70 PR Interval:    QRS Duration: 101 QT Interval:  505 QTC Calculation: 545 R Axis:   -26 Text Interpretation: Sinus rhythm Borderline left axis deviation Low voltage, precordial leads Borderline T wave abnormalities Prolonged QT interval No significant change since last tracing Confirmed by Deno Etienne [phone]) on 08/14/2020 1:10:34 PM Corrected QTc is 475.  Radiology No results found.  Procedures Procedures (including critical care time)  Medications Ordered in ED Medications  sodium chloride 0.9 % bolus 1,000 mL (0 mLs Intravenous Stopped 08/14/20 1402)  metoCLOPramide (REGLAN) injection 10 mg (10 mg Intravenous Given 08/14/20 1220)  diphenhydrAMINE (BENADRYL) injection 12.5 mg (12.5 mg Intravenous Given 08/14/20 1222)  potassium chloride SA (KLOR-CON) CR tablet 40 mEq (40 mEq Oral Given 08/14/20 1348)    ED Course  I have reviewed the triage vital signs and the nursing notes.  Pertinent labs &  imaging results that were available during my care of the patient were reviewed by me and considered in my medical decision making (see chart for details).    MDM Rules/Calculators/A&P                          Patient presenting with multiple complaints over the last few days including posterior headache, lightheadedness, palpitations, facial swelling and rash.  She denies chest pain, exertional symptoms, shortness of breath, or other neuro deficits. She was able to capture a video of her palpitations while on the  cardiac monitor at work.  Per my review, it appears patient was in SVT with heart rate in the 180s, regular narrow complex tachycardia.  She has no known history of SVT. She denies any neuro deficits and none appreciated on exam today.  No obvious facial swelling or rash today on exam, no obvious palpable lymphadenopathy is present.  EKG normal sinus rhythm today, QTc is normal.  Labs with slightly low potassium at 3.4, replaced orally.  Headache treated with Reglan and Benadryl, IV fluids given lightheadedness with improvement.  Patient's husband is at bedside, reports he is a critical care physician at [employer].  He requests immunologic studies such as ANA, rheumatoid factor, CRP, ESR, lupus panel, RMSF panel.  Discussion with patient and her husband that while the emergency department is able to send these tests, there is lack of follow-up of these results, especially given patient does not currently have a PCP.  Both patient and her husband verbalized understanding of this and report they plan to follow results on MyChart.  Per patient discussion with Dr. Tyrone Nine, doxycycline will be prescribed to cover possible tickborne illness.  She will also be prescribed metoprolol at a low dose for palpitations which appear to likely be SVT, though unable to be sure given lack of EKG and limitations of video of recorded cardiac monitor on patients phone.  She is provided cardiology referral and strongly  encouraged to establish PCP for regular management and follow-up of laboratory test today.  She may benefit from Holter monitor for palpitations for better evaluation.  Covid test was also sent.  Discussed reasons to return to the ED.  Patient verbalized understanding and agrees with care plan.  Patient was evaluated by Dr. Tyrone Nine, who guided work-up and care plan.     Final Clinical Impression(s) / ED Diagnoses Final diagnoses:  Palpitations  Acute nonintractable headache, unspecified headache type  Rash    Rx / DC Orders ED Discharge Orders         Ordered    doxycycline (VIBRAMYCIN) 100 MG capsule  2 times daily        08/14/20 1335    metoprolol succinate (TOPROL-XL) 25 MG 24 hr tablet  Daily        08/14/20 1335           Lawrence Mitch, [place] N, PA-C 08/14/20 [place], [place], DO 08/16/20 1505

## 2020-08-14 NOTE — Discharge Instructions (Addendum)
It is important that you establish primary care.  As discussed, the ED has difficulty following up on the laboratory results you requested. Please follow outpatient to review these results with a PCP. Please take the antibiotic, Doxycycline, every 12 hours until gone. A side effect of this medication includes hypersensitivity to the suns rays - please take measures to protect your skin from the sun while taking this medication. Begin taking the metoprolol once daily to help control your palpitations. You have been provided a referral to Cardiology. You may benefit from a holter monitor.  Return to the ED for persistent palpitations, chest pain, or concerning symptoms.

## 2020-08-14 NOTE — ED Triage Notes (Signed)
Pt states she is having a occipital headache, facial swelling, rash, intermittent tachycardia.  Pt went to urgent care for same.  Intermittent dizziness.

## 2020-08-16 LAB — ANTINUCLEAR ANTIBODIES, IFA: ANA Ab, IFA: NEGATIVE

## 2020-08-16 LAB — RHEUMATOID FACTOR: Rheumatoid fact SerPl-aCnc: <10 IU/mL (ref 0.0–13.9)

## 2020-08-16 LAB — ROCKY MTN SPOTTED FVR ABS PNL(IGG+IGM)
RMSF IgG: NEGATIVE
RMSF IgM: 0.41 index (ref 0.00–0.89)

## 2020-08-17 LAB — LUPUS ANTICOAGULANT PANEL
DRVVT: 36.3 s (ref 0.0–47.0)
PTT Lupus Anticoagulant: 36.2 s (ref 0.0–51.9)

## 2020-08-20 ENCOUNTER — Other Ambulatory Visit: Payer: Self-pay

## 2020-08-20 ENCOUNTER — Ambulatory Visit: Payer: 59 | Admitting: Interventional Cardiology

## 2020-08-20 ENCOUNTER — Encounter: Payer: Self-pay | Admitting: Interventional Cardiology

## 2020-08-20 VITALS — BP 134/80 | HR 79 | Ht 68.0 in | Wt 141.0 lb

## 2020-08-20 DIAGNOSIS — R519 Headache, unspecified: Secondary | ICD-10-CM

## 2020-08-20 DIAGNOSIS — I471 Supraventricular tachycardia: Secondary | ICD-10-CM

## 2020-08-20 DIAGNOSIS — M542 Cervicalgia: Secondary | ICD-10-CM | POA: Diagnosis not present

## 2020-08-20 NOTE — Addendum Note (Signed)
Addended by: Drue Novel I on: 08/20/2020 12:20 PM   Modules accepted: Orders

## 2020-08-20 NOTE — Progress Notes (Signed)
Cardiology Office Note   Date:  08/20/2020   ID:  Lisa Kelley, DOB 1961-06-03, MRN [id number]  PCP:  Patient, No Pcp Per    No chief complaint on file.    Wt Readings from Last 3 Encounters:  08/20/20 141 lb (64 kg)  08/14/20 140 lb (63.5 kg)  08/20/18 142 lb (64.4 kg)       History of Present Illness: Lisa Kelley is a 59 y.o. female who is being seen today for the evaluation of palpitations at the request of No ref. provider found.  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 A few weeks ago, she had some palpitations, felt like she had PVCs.  She had some headaches and HR variability noted at work at times.  Headaches also started in the back of her neck.   Finally, she went to ER on 9/1 due to persistent headache and dizziness, fatigue.  She missed work for the first time in 20 years that day.   She typically exercises and feels well.  Walking 4-6 miles is her most common exercise.  She feels with that exercise.  Denies : Chest pain. Leg edema. Nitroglycerin use. Orthopnea.  Paroxysmal nocturnal dyspnea. Shortness of breath. Syncope.     Past Medical History:  Diagnosis Date  . Headache   . Palpitations   . PVC (premature ventricular contraction)     Past Surgical History:  Procedure Laterality Date  . CESAREAN SECTION    . FACIAL COSMETIC SURGERY    . MYOMECTOMY    . TONSILLECTOMY       Current Outpatient Medications  Medication Sig Dispense Refill  . doxycycline (VIBRAMYCIN) 100 MG capsule Take 1 capsule (100 mg total) by mouth 2 (two) times daily for 7 days. 14 capsule 0  . naproxen (NAPROSYN) 500 MG tablet Take 1 tablet (500 mg total) by mouth 2 (two) times daily. 30 tablet 0  . metoprolol succinate (TOPROL-XL) 25 MG 24 hr tablet Take 0.5 tablets (12.5 mg total) by mouth daily. (Patient not taking: Reported on 08/20/2020) 15 tablet 0   No current facility-administered medications for this visit.    Allergies:   Patient has no known allergies.     Social History:  The patient  reports that she has never smoked. She has never used smokeless tobacco. She reports current alcohol use of about 2.0 standard drinks of alcohol per week. She reports that she does not use drugs.   Family History:  The patient's family history includes Cancer in her father and mother; Diabetes in her mother; Heart attack in her mother; Heart failure in her mother; Hypertension in her mother; Stroke in her mother.    ROS:  Please see the history of present illness.   Otherwise, review of systems are positive for persistent.   All other systems are reviewed and negative.    PHYSICAL EXAM: VS:  BP 134/80   Pulse 79   Ht 5\' 8"  (1.727 m)   Wt 141 lb (64 kg)   SpO2 100%   BMI 21.44 kg/m  , BMI Body mass index is 21.44 kg/m. GEN: Well nourished, well developed, in no acute distress  HEENT: normal  Neck: no JVD, carotid bruits, or masses Cardiac: RRR; no murmurs, rubs, or gallops,no edema  Respiratory:  clear to auscultation bilaterally, normal work of breathing GI: soft, nontender, nondistended, + BS MS: no deformity or atrophy  Skin: warm and dry, no rash Neuro:  Strength and sensation are  intact Psych: euthymic mood, full affect   EKG:   The ekg ordered today demonstrates NSR, nonspecific ST flattening   Recent Labs: 08/06/2020: TSH 2.42 08/14/2020: BUN 7; Creatinine, Ser 0.65; Hemoglobin 13.4; Magnesium 2.1; Platelets 297; Potassium 3.4; Sodium 141   Lipid Panel No results found for: CHOL, TRIG, HDL, CHOLHDL, VLDL, LDLCALC, LDLDIRECT   Other studies Reviewed: Additional studies/ records that were reviewed today with results demonstrating: ER records reviewed.   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  Now in sinus rhythm by exam. She was given metoprolol.  Check echo.  Strip available in the ER note.  Narrow complex.  I suspect this may have been secondary to her general illness.  Unlikely that SVT caused her sx noted below.   She has not started metoprolol.  Can use prn.  If SVT sx return and metoprolol does not help, will refer to EP.  2. Headaches: Worse with lying down.  THey have returned.   She has a host of nonspecific sx including neck pain, feling her face to be swollen in sunlight, feeling like her lymph nodes are swollen. Unclear etiology.  CRP was low.  Autoimmune w/u in progress.  Will defer to PCP regarding these symptoms.  3. Neck pain: persistent as well.  Will plan for MRI of head ad neck to evaluate given persistent sx.    Current medicines are reviewed at length with the patient today.  The patient concerns regarding her medicines were addressed.  The following changes have been made:  No change  Labs/ tests ordered today include:  No orders of the defined types were placed in this encounter.   Recommend 150 minutes/week of aerobic exercise Low fat, low carb, high fiber diet recommended  Disposition:   FU in 2 months, or possible referral to EP if she has refractory palpitations   Signed, Larae Grooms, MD  08/20/2020 11:41 AM    [facility] [address], [place], [place]  [address] Phone: [phone]; Fax: [phone]

## 2020-08-20 NOTE — Addendum Note (Signed)
Addended by: Drue Novel I on: 08/20/2020 12:14 PM   Modules accepted: Orders

## 2020-08-20 NOTE — Patient Instructions (Signed)
Medication Instructions:  Your physician recommends that you continue on your current medications as directed. Please refer to the Current Medication list given to you today.  *If you need a refill on your cardiac medications before your next appointment, please call your pharmacy*   Lab Work: None today  If you have labs (blood work) drawn today and your tests are completely normal, you will receive your results only by: Marland Kitchen MyChart Message (if you have MyChart) OR . A paper copy in the mail If you have any lab test that is abnormal or we need to change your treatment, we will call you to review the results.   Testing/Procedures: Your physician has requested that you have an echocardiogram. Echocardiography is a painless test that uses sound waves to create images of your heart. It provides your doctor with information about the size and shape of your heart and how well your heart's chambers and valves are working. This procedure takes approximately one hour. There are no restrictions for this procedure.  Your physician recommends that you have an MRA of the head and neck   Follow-Up: At [facility], you and your health needs are our priority.  As part of our continuing mission to provide you with exceptional heart care, we have created designated Provider Care Teams.  These Care Teams include your primary Cardiologist (physician) and Advanced Practice Providers (APPs -  Physician Assistants and Nurse Practitioners) who all work together to provide you with the care you need, when you need it.  We recommend signing up for the patient portal called "MyChart".  Sign up information is provided on this After Visit Summary.  MyChart is used to connect with patients for Virtual Visits (Telemedicine).  Patients are able to view lab/test results, encounter notes, upcoming appointments, etc.  Non-urgent messages can be sent to your provider as well.   To learn more about what you can do with  MyChart, go to [email or web address].    Your next appointment:   2 month(s)  The format for your next appointment:   In Person  Provider:   You may see Casandra Doffing, MD   Other Instructions Let us know if your symptoms change or worsen.

## 2020-08-20 NOTE — Addendum Note (Signed)
Addended by: Drue Novel I on: 08/20/2020 12:05 PM   Modules accepted: Orders

## 2020-08-21 ENCOUNTER — Other Ambulatory Visit: Payer: Self-pay | Admitting: Interventional Cardiology

## 2020-08-21 ENCOUNTER — Ambulatory Visit (HOSPITAL_COMMUNITY)
Admission: RE | Admit: 2020-08-21 | Discharge: 2020-08-21 | Disposition: A | Discharge status: Home / Self Care | Payer: 59 | Source: Ambulatory Visit | Attending: Interventional Cardiology | Admitting: Interventional Cardiology

## 2020-08-21 DIAGNOSIS — S199XXA Unspecified injury of neck, initial encounter: Secondary | ICD-10-CM | POA: Diagnosis not present

## 2020-08-21 DIAGNOSIS — R519 Headache, unspecified: Secondary | ICD-10-CM | POA: Diagnosis not present

## 2020-08-21 DIAGNOSIS — M542 Cervicalgia: Secondary | ICD-10-CM | POA: Insufficient documentation

## 2020-08-21 IMAGING — MR MR CERVICAL SPINE WO/W CM
24 of 27 series · 38 of 48 positions shown · IV contrast (gadavist)
Comparison: None.

CLINICAL DATA: Neck trauma with impaired range of motion.
Persistent headaches and neck stiffness for 3 weeks.

EXAM:
MRI CERVICAL SPINE WITHOUT AND WITH CONTRAST
TECHNIQUE: Multiplanar and multiecho pulse sequences of the cervical spine, to
include the craniocervical junction and cervicothoracic junction,
were obtained without and with intravenous contrast.
CONTRAST:  6mL GADAVIST GADOBUTROL 1 MMOL/ML IV SOLN

[Series 5: DWI · axial · 3.0mm · 1.36mm/px · z∈[-47,+105]mm · 3 of 104 slices shown (1 of 4)]
[im 1/104]
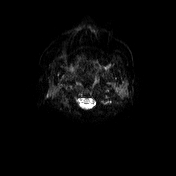
[im 52/104]
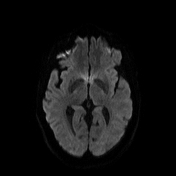
[im 104/104]
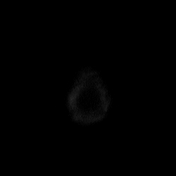

[Series 6: DWI · axial · 3.0mm · 1.36mm/px · 1 of 52 slices shown (2 of 4)]
[im 1/52]
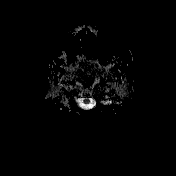

[Series 7: T1 · sagittal · 5.0mm · 0.75mm/px · 1 of 24 slices shown (1 of 6)]
[im 1/24]
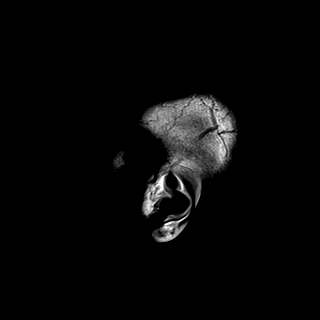

[Series 8: T2 · axial · 5.0mm · 0.62mm/px · 1 of 24 slices shown (1 of 3)]
[im 1/24]
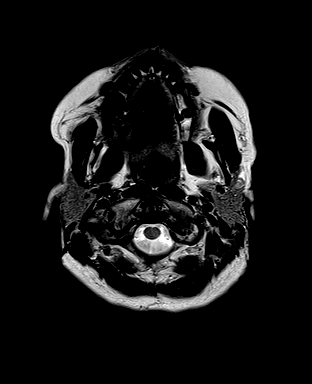

[Series 9: mip_images(sw) · axial · 24.0mm · 0.75mm/px · z∈[-35,+96]mm · 2 of 45 slices shown]
[im 1/45]
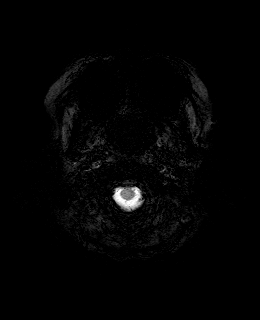
[im 45/45]
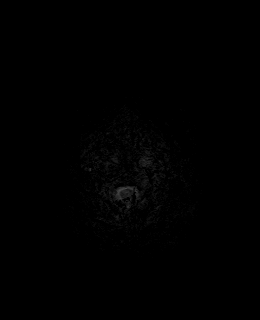

[Series 10: swi_images · axial · 3.0mm · 0.75mm/px · z∈[-45,+107]mm · 2 of 52 slices shown]
[im 1/52]
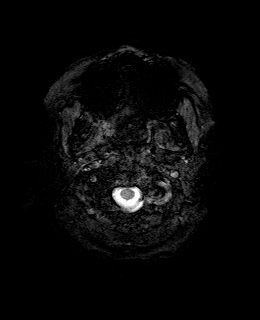
[im 52/52]
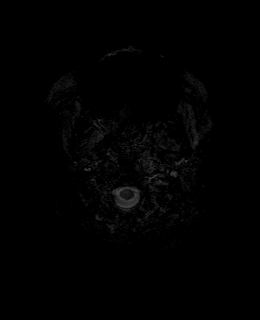

[Series 11: FLAIR · axial · 3.0mm · 0.75mm/px · z∈[-45,+107]mm · 2 of 52 slices shown]
[im 1/52]
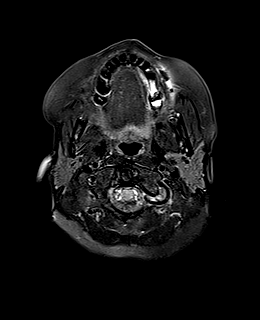
[im 52/52]
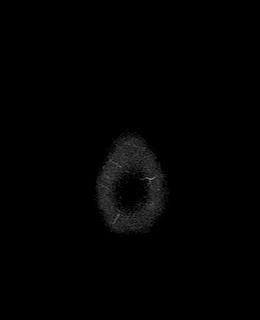

[Series 12: T1 · axial · 1.0mm · 0.94mm/px · z∈[-36,+106]mm · 5 of 144 slices shown (2 of 6)]
[im 1/144]
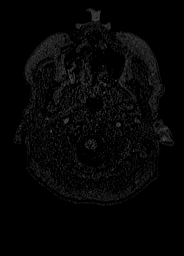
[im 36/144]
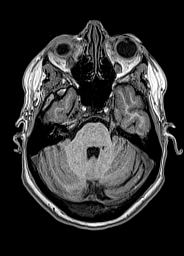
[im 72/144]
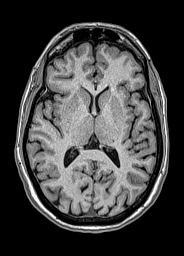
[im 108/144]
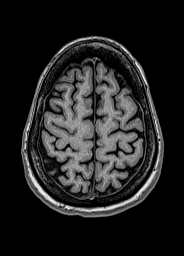
[im 144/144]
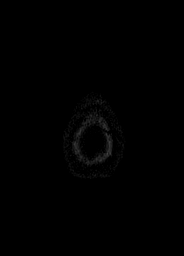

[Series 13: DWI · coronal · 5.0mm · 1.31mm/px · 2 of 64 slices shown (3 of 4)]
[im 1/64]
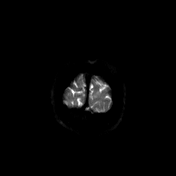
[im 64/64]
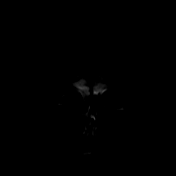

[Series 14: DWI · coronal · 5.0mm · 1.31mm/px · 1 of 32 slices shown (4 of 4)]
[im 1/32]
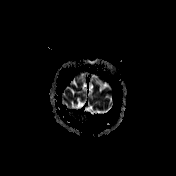

[Series 15: T2 · coronal · 5.0mm · 0.69mm/px · 1 of 32 slices shown (2 of 3)]
[im 1/32]
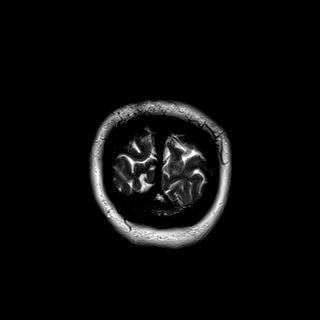

[Series 31: T1 · sagittal · 3.0mm · 0.69mm/px · 1 of 14 slices shown (3 of 6)]
[im 1/14]
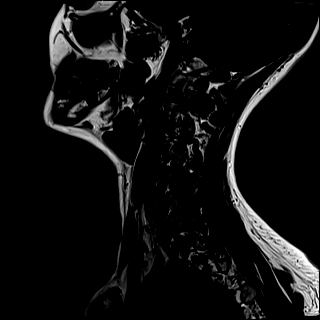

[Series 32: STIR · sagittal · 3.0mm · 0.86mm/px · 1 of 14 slices shown]
[im 1/14]
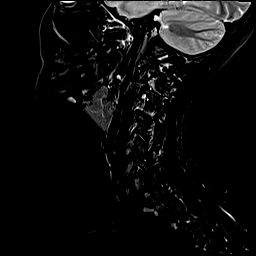

[Series 34: T2 · axial · 3.0mm · 0.70mm/px · 1 of 32 slices shown (3 of 3)]
[im 1/32]
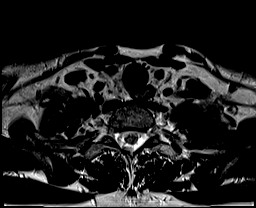

[Series 35: GRE · axial · 3.0mm · 0.35mm/px · 1 of 32 slices shown]
[im 1/32]
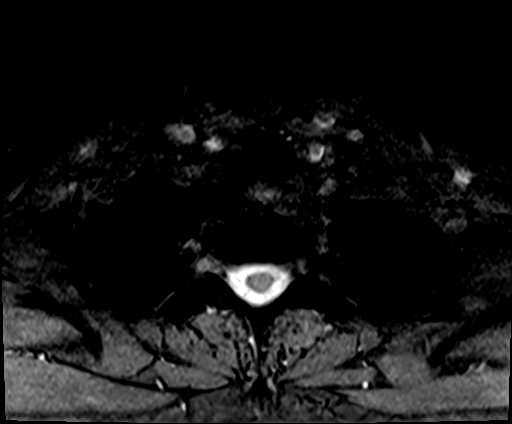

[Series 36: T1 · axial · 3.0mm · 0.35mm/px · 1 of 32 slices shown (4 of 6)]
[im 1/32]
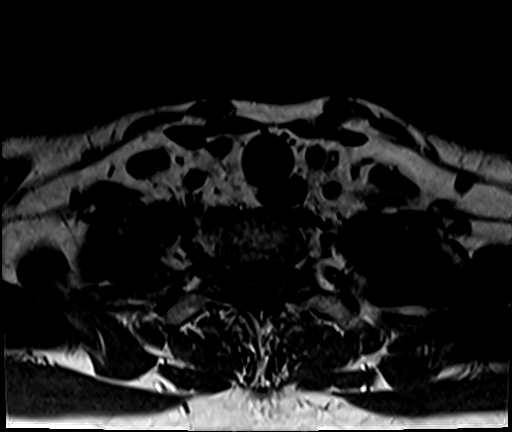

[Series 37: T2 post-contrast · sagittal · 3.0mm · 0.69mm/px · 1 of 14 slices shown]
[im 1/14]
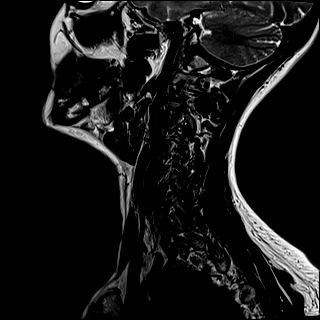

[Series 38: T1 fat-sat post-contrast · sagittal · 3.0mm · 0.86mm/px · 1 of 14 slices shown]
[im 1/14]
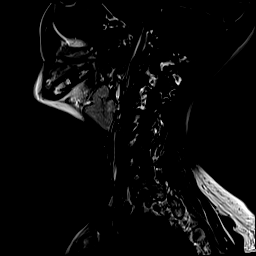

[Series 39: T1 post-contrast · axial · 3.0mm · 0.35mm/px · 1 of 32 slices shown (1 of 4)]
[im 1/32]
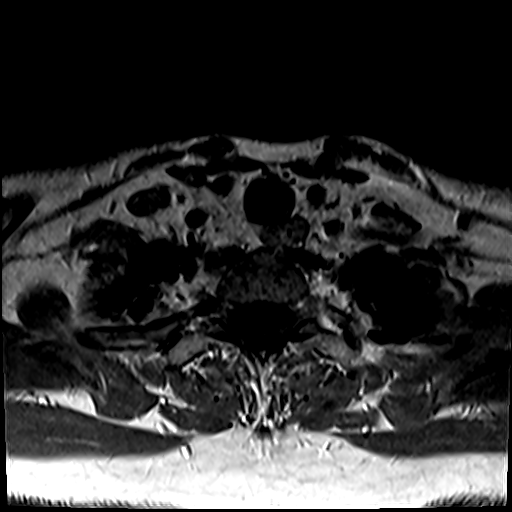

[Series 40: T1 post-contrast · axial · 1.0mm · 0.94mm/px · z∈[-31,+111]mm · 5 of 144 slices shown (2 of 4)]
[im 1/144]
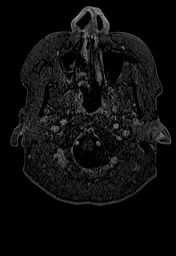
[im 36/144]
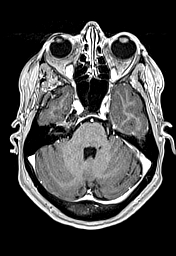
[im 72/144]
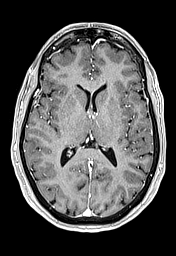
[im 108/144]
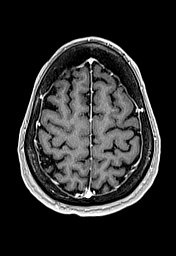
[im 144/144]
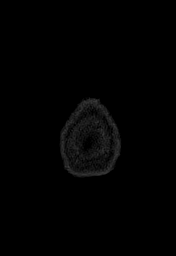

[Series 41: T1 · sagittal · 4.0mm · 0.94mm/px · 1 of 30 slices shown (5 of 6)]
[im 1/30]
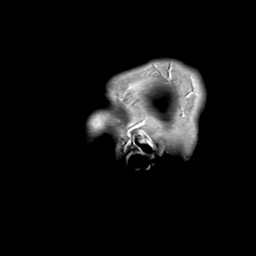

[Series 42: T1 · coronal · 4.0mm · 0.94mm/px · 1 of 30 slices shown (6 of 6)]
[im 1/30]
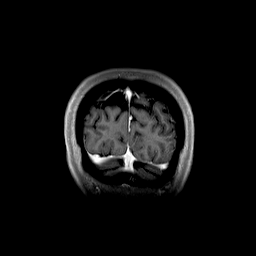

[Series 43: T1 post-contrast · coronal · 5.0mm · 0.43mm/px · 1 of 24 slices shown (3 of 4)]
[im 1/24]
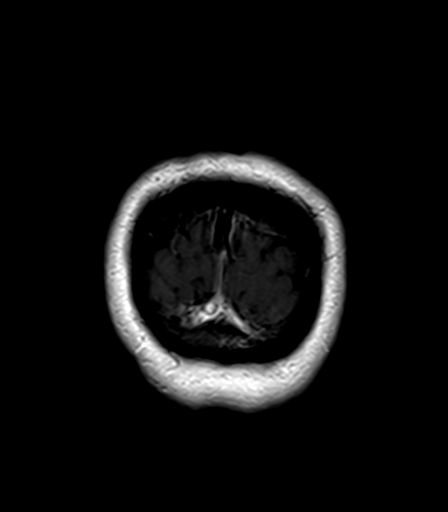

[Series 44: T1 post-contrast · sagittal · 5.0mm · 0.94mm/px · 1 of 24 slices shown (4 of 4)]
[im 1/24]
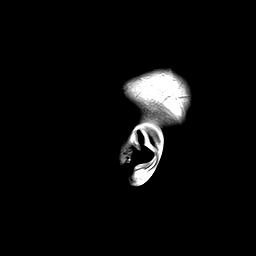

[38 of 48 positions shown; findings below may reference images not displayed]

FINDINGS: Alignment: Normal

Vertebrae: No fracture, evidence of discitis, or bone lesion.

Cord: Normal signal and morphology.

Posterior Fossa, vertebral arteries, paraspinal tissues: No evidence
of mass or inflammation

Disc levels:

C2-3: Small central disc protrusion.  No impingement

C3-4: Small central disc protrusion on sagittal images. No
impingement

C4-5: Minor annulus bulging

C5-6: Minor annulus bulging with small left paracentral protrusion
and tiny buttressing osteophytes

C6-7: Small central protrusion suggested on sagittal images

C7-T1:Unremarkable.
IMPRESSION: No explanation for symptoms. No inflammatory process or impingement
seen throughout the cervical spine.

## 2020-08-21 IMAGING — MR MR HEAD WO/W CM
24 of 27 series · 38 of 48 positions shown · IV contrast (gadavist)
Comparison: None.

CLINICAL DATA: Headache, new or worsening

EXAM:
MRI HEAD WITHOUT AND WITH CONTRAST
TECHNIQUE: Multiplanar, multiecho pulse sequences of the brain and surrounding
structures were obtained without and with intravenous contrast.
CONTRAST:  6mL GADAVIST GADOBUTROL 1 MMOL/ML IV SOLN

[Series 5: DWI · axial · 3.0mm · 1.36mm/px · z∈[-47,+105]mm · 3 of 104 slices shown (1 of 4)]
[im 1/104]
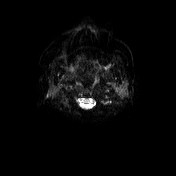
[im 52/104]
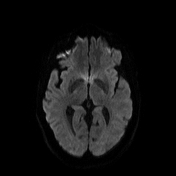
[im 104/104]
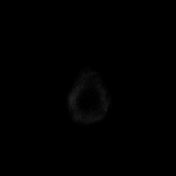

[Series 6: DWI · axial · 3.0mm · 1.36mm/px · 1 of 52 slices shown (2 of 4)]
[im 1/52]
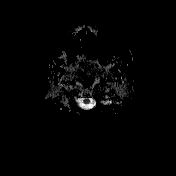

[Series 7: T1 · sagittal · 5.0mm · 0.75mm/px · 1 of 24 slices shown (1 of 6)]
[im 1/24]
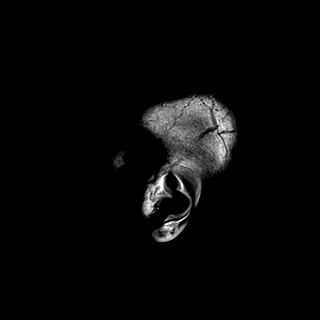

[Series 8: T2 · axial · 5.0mm · 0.62mm/px · 1 of 24 slices shown (1 of 3)]
[im 1/24]
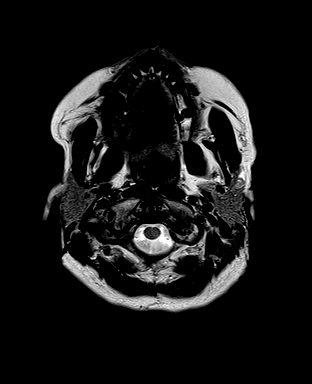

[Series 9: mip_images(sw) · axial · 24.0mm · 0.75mm/px · z∈[-35,+96]mm · 2 of 45 slices shown]
[im 1/45]
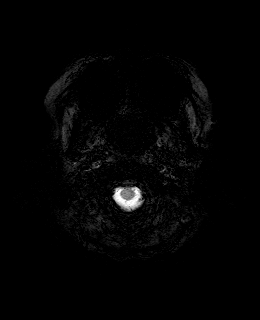
[im 45/45]
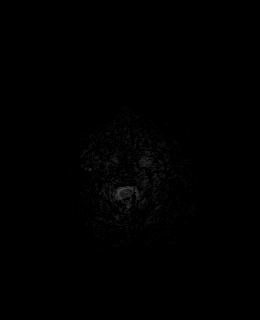

[Series 10: swi_images · axial · 3.0mm · 0.75mm/px · z∈[-45,+107]mm · 2 of 52 slices shown]
[im 1/52]
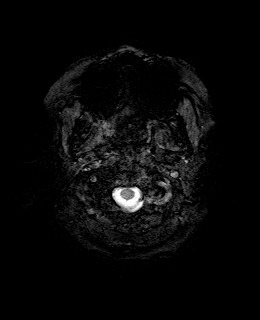
[im 52/52]
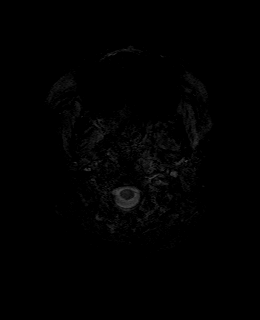

[Series 11: FLAIR · axial · 3.0mm · 0.75mm/px · z∈[-45,+107]mm · 2 of 52 slices shown]
[im 1/52]
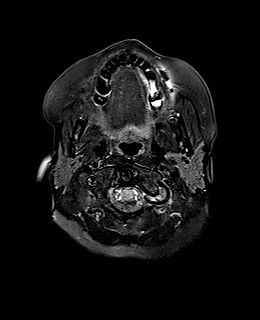
[im 52/52]
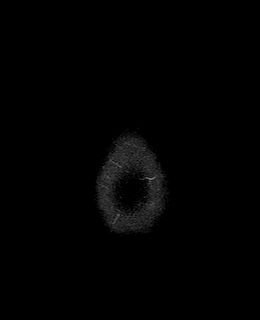

[Series 12: T1 · axial · 1.0mm · 0.94mm/px · z∈[-36,+106]mm · 5 of 144 slices shown (2 of 6)]
[im 1/144]
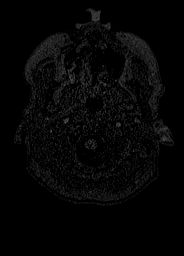
[im 36/144]
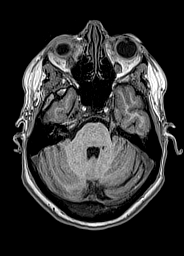
[im 72/144]
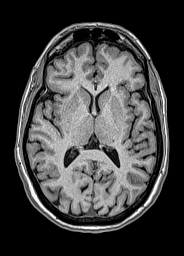
[im 108/144]
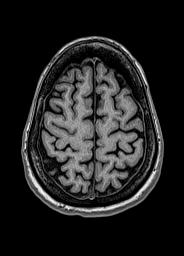
[im 144/144]
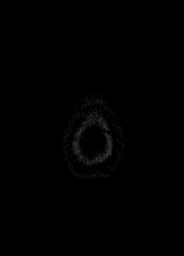

[Series 13: DWI · coronal · 5.0mm · 1.31mm/px · 2 of 64 slices shown (3 of 4)]
[im 1/64]
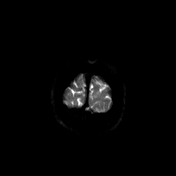
[im 64/64]
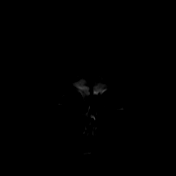

[Series 14: DWI · coronal · 5.0mm · 1.31mm/px · 1 of 32 slices shown (4 of 4)]
[im 1/32]
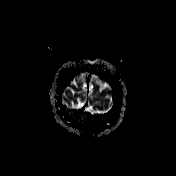

[Series 15: T2 · coronal · 5.0mm · 0.69mm/px · 1 of 32 slices shown (2 of 3)]
[im 1/32]
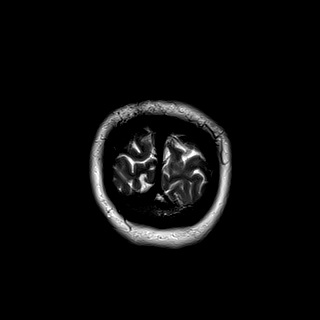

[Series 31: T1 · sagittal · 3.0mm · 0.69mm/px · 1 of 14 slices shown (3 of 6)]
[im 1/14]
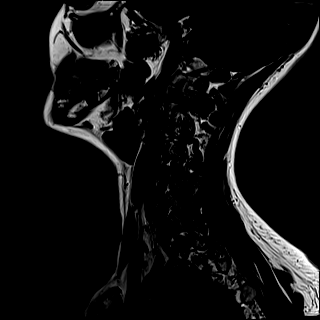

[Series 32: STIR · sagittal · 3.0mm · 0.86mm/px · 1 of 14 slices shown]
[im 1/14]
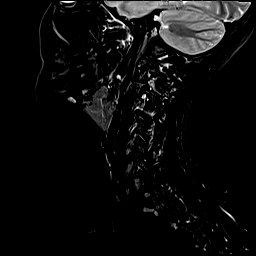

[Series 34: T2 · axial · 3.0mm · 0.70mm/px · 1 of 32 slices shown (3 of 3)]
[im 1/32]
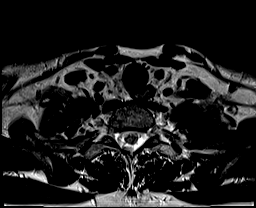

[Series 35: GRE · axial · 3.0mm · 0.35mm/px · 1 of 32 slices shown]
[im 1/32]
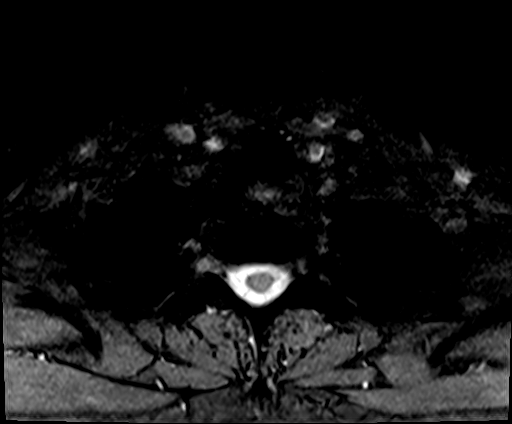

[Series 36: T1 · axial · 3.0mm · 0.35mm/px · 1 of 32 slices shown (4 of 6)]
[im 1/32]
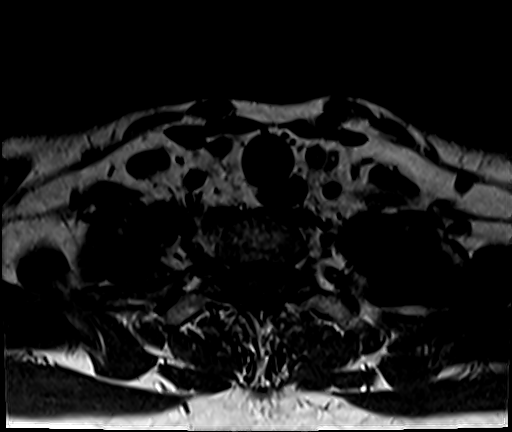

[Series 37: T2 post-contrast · sagittal · 3.0mm · 0.69mm/px · 1 of 14 slices shown]
[im 1/14]
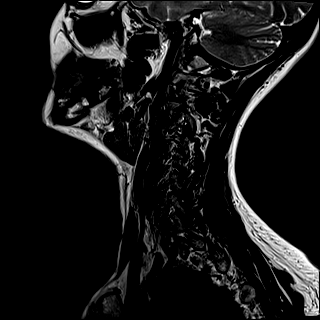

[Series 38: T1 fat-sat post-contrast · sagittal · 3.0mm · 0.86mm/px · 1 of 14 slices shown]
[im 1/14]
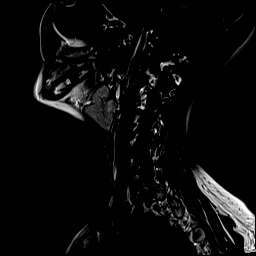

[Series 39: T1 post-contrast · axial · 3.0mm · 0.35mm/px · 1 of 32 slices shown (1 of 4)]
[im 1/32]
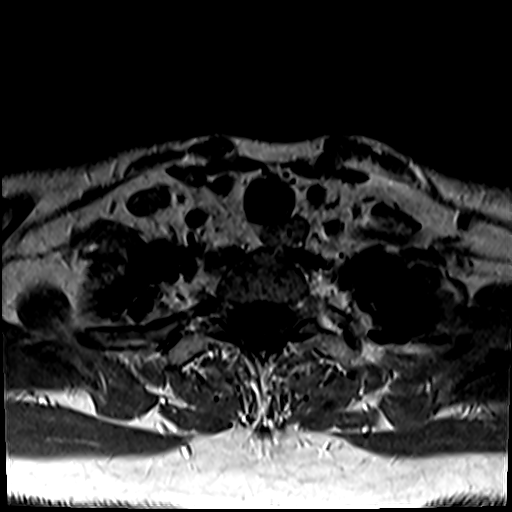

[Series 40: T1 post-contrast · axial · 1.0mm · 0.94mm/px · z∈[-31,+111]mm · 5 of 144 slices shown (2 of 4)]
[im 1/144]
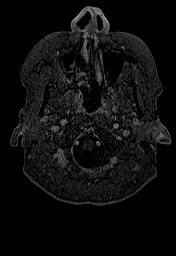
[im 36/144]
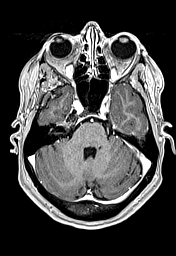
[im 72/144]
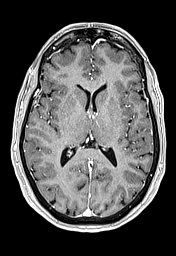
[im 108/144]
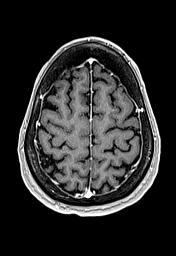
[im 144/144]
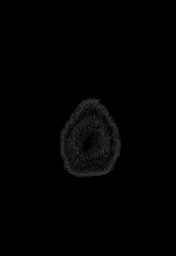

[Series 41: T1 · sagittal · 4.0mm · 0.94mm/px · 1 of 30 slices shown (5 of 6)]
[im 1/30]
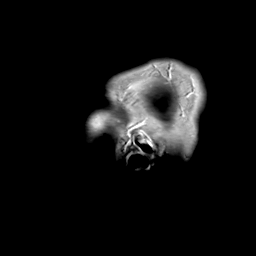

[Series 42: T1 · coronal · 4.0mm · 0.94mm/px · 1 of 30 slices shown (6 of 6)]
[im 1/30]
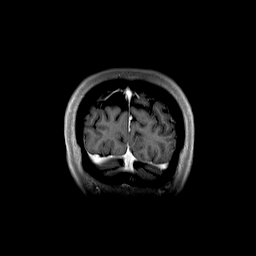

[Series 43: T1 post-contrast · coronal · 5.0mm · 0.43mm/px · 1 of 24 slices shown (3 of 4)]
[im 1/24]
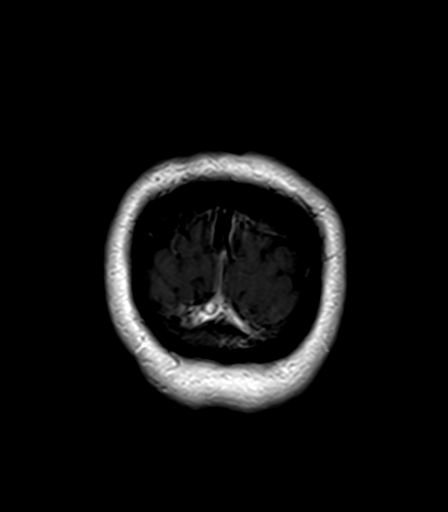

[Series 44: T1 post-contrast · sagittal · 5.0mm · 0.94mm/px · 1 of 24 slices shown (4 of 4)]
[im 1/24]
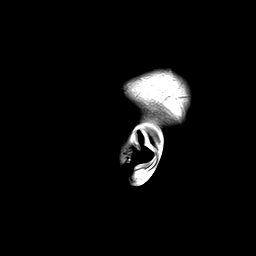

[38 of 48 positions shown; findings below may reference images not displayed]

FINDINGS: Brain: No infarction, hemorrhage, hydrocephalus, extra-axial
collection or mass lesion. No white matter disease or atrophy. No
abnormal intracranial enhancement

Vascular: Normal flow voids.

Skull and upper cervical spine: Normal marrow signal.

Sinuses/Orbits: Negative.  No sinusitis.
IMPRESSION: Normal exam.

## 2020-08-21 IMAGING — MR MR MRA HEAD W/O CM
24 of 27 series · 38 of 48 positions shown · non-contrast
Comparison: None.

CLINICAL DATA: Headache, new or worsening.

EXAM:
MRA HEAD WITHOUT CONTRAST
TECHNIQUE: Angiographic images of the Circle of Willis were obtained using MRA
technique without intravenous contrast.

[Series 5: DWI · axial · 3.0mm · 1.36mm/px · z∈[-47,+105]mm · 3 of 104 slices shown (1 of 4)]
[im 1/104]
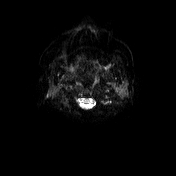
[im 52/104]
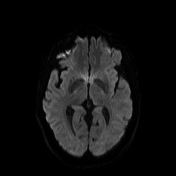
[im 104/104]
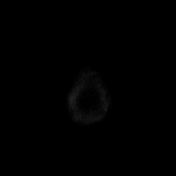

[Series 6: DWI · axial · 3.0mm · 1.36mm/px · 1 of 52 slices shown (2 of 4)]
[im 1/52]
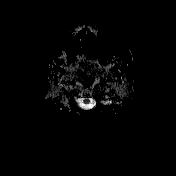

[Series 7: T1 · sagittal · 5.0mm · 0.75mm/px · 1 of 24 slices shown (1 of 6)]
[im 1/24]
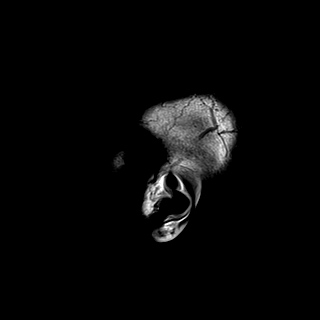

[Series 8: T2 · axial · 5.0mm · 0.62mm/px · 1 of 24 slices shown (1 of 3)]
[im 1/24]
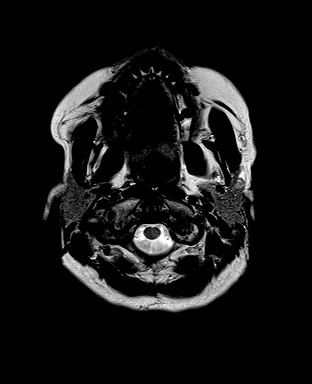

[Series 9: mip_images(sw) · axial · 24.0mm · 0.75mm/px · z∈[-35,+96]mm · 2 of 45 slices shown]
[im 1/45]
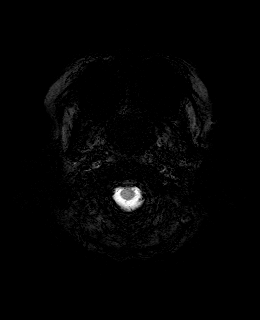
[im 45/45]
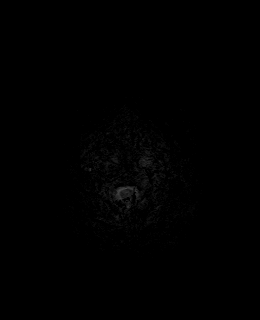

[Series 10: swi_images · axial · 3.0mm · 0.75mm/px · z∈[-45,+107]mm · 2 of 52 slices shown]
[im 1/52]
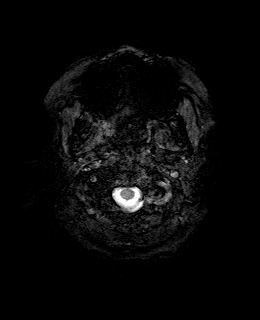
[im 52/52]
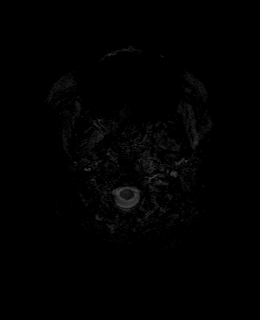

[Series 11: FLAIR · axial · 3.0mm · 0.75mm/px · z∈[-45,+107]mm · 2 of 52 slices shown]
[im 1/52]
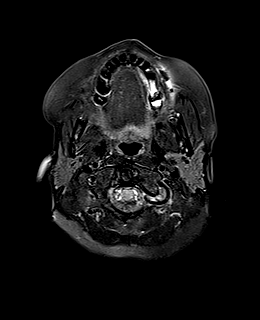
[im 52/52]
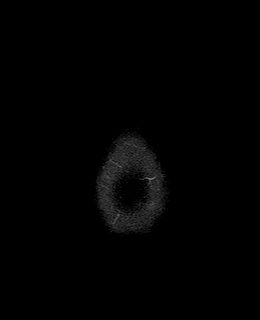

[Series 12: T1 · axial · 1.0mm · 0.94mm/px · z∈[-36,+106]mm · 5 of 144 slices shown (2 of 6)]
[im 1/144]
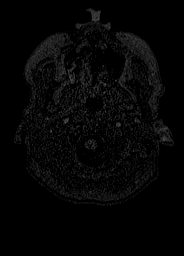
[im 36/144]
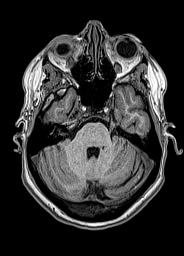
[im 72/144]
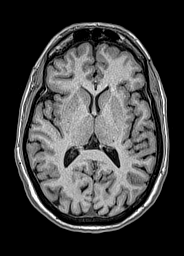
[im 108/144]
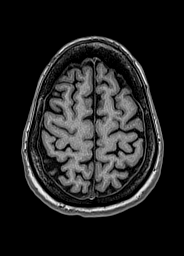
[im 144/144]
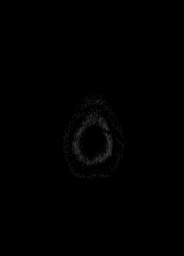

[Series 13: DWI · coronal · 5.0mm · 1.31mm/px · 2 of 64 slices shown (3 of 4)]
[im 1/64]
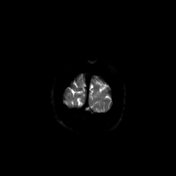
[im 64/64]
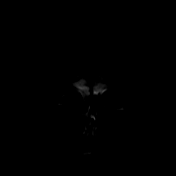

[Series 14: DWI · coronal · 5.0mm · 1.31mm/px · 1 of 32 slices shown (4 of 4)]
[im 1/32]
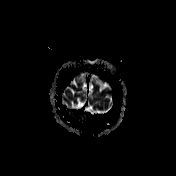

[Series 15: T2 · coronal · 5.0mm · 0.69mm/px · 1 of 32 slices shown (2 of 3)]
[im 1/32]
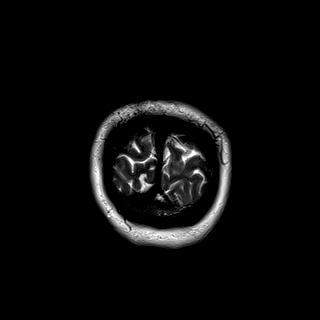

[Series 31: T1 · sagittal · 3.0mm · 0.69mm/px · 1 of 14 slices shown (3 of 6)]
[im 1/14]
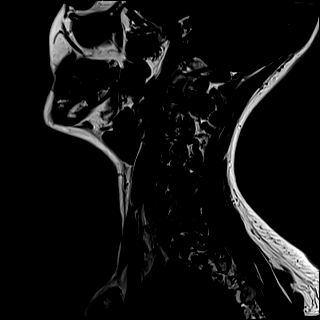

[Series 32: STIR · sagittal · 3.0mm · 0.86mm/px · 1 of 14 slices shown]
[im 1/14]
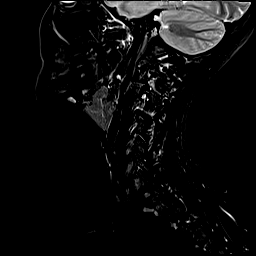

[Series 34: T2 · axial · 3.0mm · 0.70mm/px · 1 of 32 slices shown (3 of 3)]
[im 1/32]
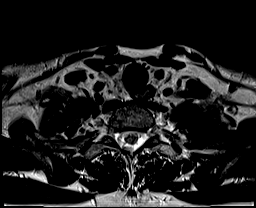

[Series 35: GRE · axial · 3.0mm · 0.35mm/px · 1 of 32 slices shown]
[im 1/32]
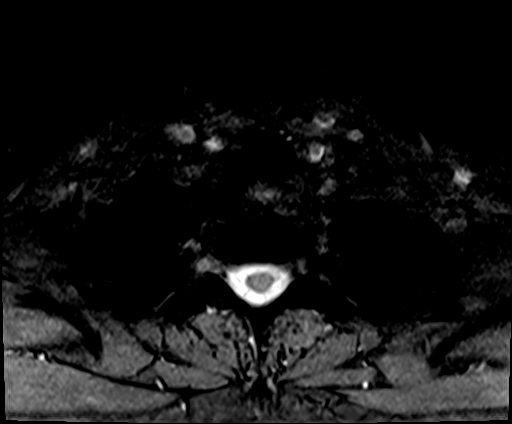

[Series 36: T1 · axial · 3.0mm · 0.35mm/px · 1 of 32 slices shown (4 of 6)]
[im 1/32]
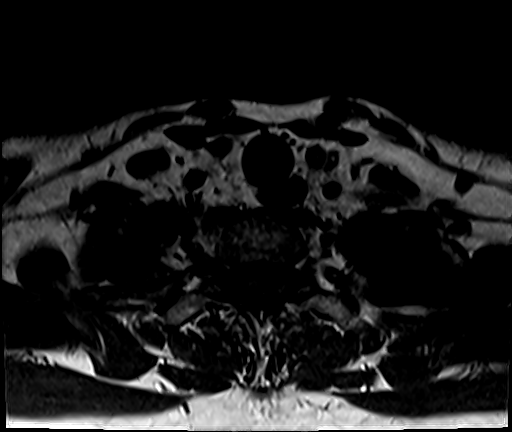

[Series 37: T2 post-contrast · sagittal · 3.0mm · 0.69mm/px · 1 of 14 slices shown]
[im 1/14]
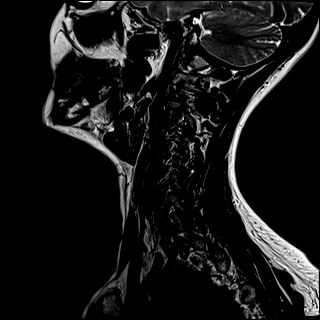

[Series 38: T1 fat-sat post-contrast · sagittal · 3.0mm · 0.86mm/px · 1 of 14 slices shown]
[im 1/14]
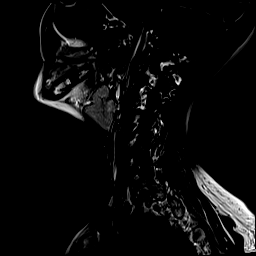

[Series 39: T1 post-contrast · axial · 3.0mm · 0.35mm/px · 1 of 32 slices shown (1 of 4)]
[im 1/32]
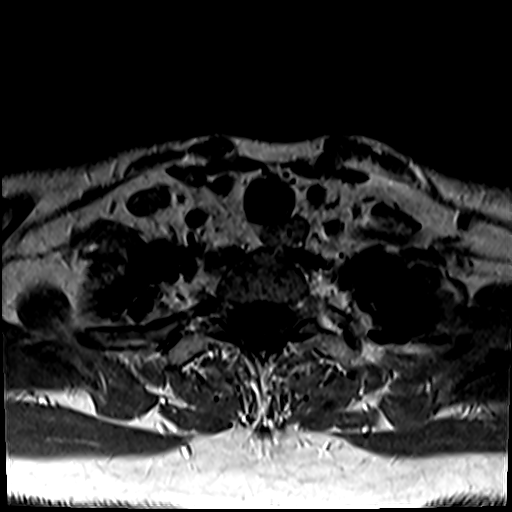

[Series 40: T1 post-contrast · axial · 1.0mm · 0.94mm/px · z∈[-31,+111]mm · 5 of 144 slices shown (2 of 4)]
[im 1/144]
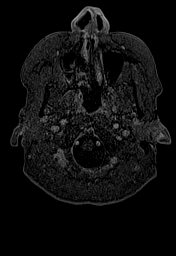
[im 36/144]
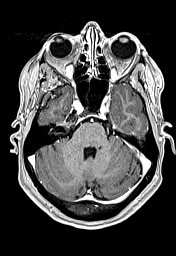
[im 72/144]
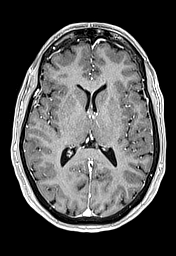
[im 108/144]
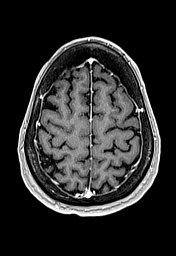
[im 144/144]
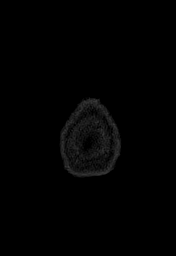

[Series 41: T1 · sagittal · 4.0mm · 0.94mm/px · 1 of 30 slices shown (5 of 6)]
[im 1/30]
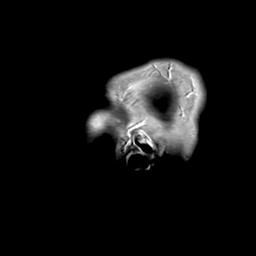

[Series 42: T1 · coronal · 4.0mm · 0.94mm/px · 1 of 30 slices shown (6 of 6)]
[im 1/30]
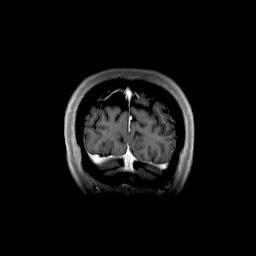

[Series 43: T1 post-contrast · coronal · 5.0mm · 0.43mm/px · 1 of 24 slices shown (3 of 4)]
[im 1/24]
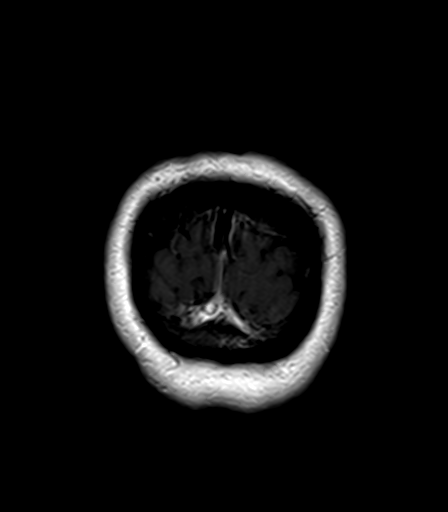

[Series 44: T1 post-contrast · sagittal · 5.0mm · 0.94mm/px · 1 of 24 slices shown (4 of 4)]
[im 1/24]
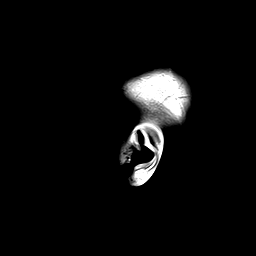

[38 of 48 positions shown; findings below may reference images not displayed]

FINDINGS: Symmetric carotid and vertebral artery size and smooth normal
contour. No visible posterior communicating arteries. No branch
occlusion, stenosis, beading, or aneurysm.
IMPRESSION: Negative intracranial MRA.

## 2020-08-21 MED ORDER — GADOBUTROL 1 MMOL/ML IV SOLN
6.0000 mL | Freq: Once | INTRAVENOUS | Status: AC | PRN
  Administered 2020-08-21: 6 mL via INTRAVENOUS

## 2020-08-21 NOTE — Progress Notes (Signed)
Called and spoke with [place]. MRI ANGIO neck order was placed incorrectly. MD wants MRI cervical spine with and without per conversation with md. Verbal order per [place] at office. Britney confirmed no pre cert needed for change in order.

## 2020-08-26 ENCOUNTER — Other Ambulatory Visit: Payer: Self-pay

## 2020-08-26 ENCOUNTER — Encounter: Payer: Self-pay | Admitting: Family Medicine

## 2020-08-26 ENCOUNTER — Ambulatory Visit (INDEPENDENT_AMBULATORY_CARE_PROVIDER_SITE_OTHER): Payer: 59 | Admitting: Family Medicine

## 2020-08-26 VITALS — BP 131/79 | HR 86 | Temp 98.2°F | Resp 15 | Ht 66.73 in | Wt 142.2 lb

## 2020-08-26 DIAGNOSIS — Z01419 Encounter for gynecological examination (general) (routine) without abnormal findings: Secondary | ICD-10-CM | POA: Diagnosis not present

## 2020-08-26 DIAGNOSIS — R519 Headache, unspecified: Secondary | ICD-10-CM | POA: Diagnosis not present

## 2020-08-26 DIAGNOSIS — M2550 Pain in unspecified joint: Secondary | ICD-10-CM

## 2020-08-26 DIAGNOSIS — E876 Hypokalemia: Secondary | ICD-10-CM

## 2020-08-26 DIAGNOSIS — Z6823 Body mass index (BMI) 23.0-23.9, adult: Secondary | ICD-10-CM | POA: Diagnosis not present

## 2020-08-26 DIAGNOSIS — R232 Flushing: Secondary | ICD-10-CM

## 2020-08-26 DIAGNOSIS — M4820 Kissing spine, site unspecified: Secondary | ICD-10-CM | POA: Diagnosis not present

## 2020-08-26 DIAGNOSIS — M5030 Other cervical disc degeneration, unspecified cervical region: Secondary | ICD-10-CM

## 2020-08-26 MED ORDER — HYDROXYZINE HCL 10 MG PO TABS: 10.0000 mg | ORAL_TABLET | Freq: Every day | ORAL | 5 refills | Status: DC

## 2020-08-26 NOTE — Patient Instructions (Signed)
Nice to meet you today.  Increase your magnesium dose to 400-500 mg a day.  Start neck stretches daily.  We will call you with lab results and discuss follow up at that time.    Headaches could be tension, allergies/sinus or  from the cervical spine changes seen on image (less likely since mild- but possible).    Start vistaril 1-3 tabs a night- tapering as we discussed. We may try gabapentin and/or physical therapy after follow up. There are many options to help you depending on your lab results and response to vistaril and stretches.

## 2020-08-26 NOTE — Progress Notes (Signed)
Patient ID: Lisa Kelley, female  DOB: 14-Nov-1961, 59 y.o.   MRN: [id number] Patient Care Team    Relationship Specialty Notifications Start End  Ma Hillock, DO PCP - General Family Medicine  08/23/20   Maisie Fus, MD Consulting Physician Obstetrics and Gynecology  08/26/20   Ralene Bathe, MD  Ophthalmology  08/26/20   Jettie Booze, MD Consulting Physician Cardiology  08/26/20   Rolm Bookbinder, MD Consulting Physician Dermatology  08/26/20     Chief Complaint  Patient presents with  . Establish Care    Just needing PCP; can do physical or schedule physical.     Subjective:  Lisa Kelley is a 59 y.o.  female present for new patient establishment. All past medical history, surgical history, allergies, family history, immunizations, medications and social history were updated in the electronic medical record today. All recent labs, ED visits and hospitalizations within the last year were reviewed.  Patient presents today for new patient appointment to discuss her recent developments of heart racing/PVCs, headaches, neck pain and lymphadenopathy.  She reports over the last 3 weeks she has had flushing, episodes of heart racing, posterior neck discomfort, anterior neck swelling, paresthesia of scalp, posterior neck/head headaches.  She is taking magnesium 200-250 mg a day.  She was seen at the urgent care and emergency room a couple weeks ago.  She has since been seen with cardiology.  She denies chest pain, shortness of breath, orthopnea, dyspnea.  She does endorse occasional dizziness, right inferior orbital/maxillary sinus swelling, mild swelling of feet and flushing.  She has been able to establish with Dr. Irish Lack (cardiology) for her SVT/heart racing symptoms.  She has been prescribed metoprolol 12.5 mg as needed.  CBC, TSH, ESR, CRP, ANA, RF, lupus/anticardiolipin and magnesium have all been normal.  CMP unremarkable with the exception of very mild hypokalemia at  3.4.  She reports she has been experiencing new allergies within the last year.  She experienced an allergy to natural honey.  Depression screen PHQ 2/9 08/26/2020  Decreased Interest 1  Down, Depressed, Hopeless 0  PHQ - 2 Score 1   No flowsheet data found.      No flowsheet data found.   There is no immunization history on file for this patient.  No exam data present  Past Medical History:  Diagnosis Date  . Headache   . Palpitations   . PVC (premature ventricular contraction)    No Known Allergies Past Surgical History:  Procedure Laterality Date  . BLEPHAROPLASTY  2018  . CESAREAN SECTION  2004  . MYOMECTOMY  2002   Fibroids; abdominal cerclage completed as well  . RHINOPLASTY  1979  . TONSILLECTOMY  1965   Family History  Problem Relation Age of Onset  . Heart failure Mother   . Hypertension Mother   . Heart attack Mother   . Diabetes Mother   . Stroke Mother   . Colon cancer Mother        colon  . Pancreatic cancer Father        pancreatic  . Prostate cancer Father   . Breast cancer Maternal Grandmother   . Prostate cancer Paternal Grandfather   . Healthy Brother   . Stroke Paternal Grandmother   . Healthy Daughter   . Healthy Brother   . 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   Safety:      -smoke alarm in the home:Yes     - wears seatbelt: Yes     - Feels safe in their relationships: Yes    Allergies as of 08/26/2020   No Known Allergies     Medication List       Accurate as of August 26, 2020 11:59 PM. If you have any questions, ask your nurse or doctor.        STOP taking these medications   naproxen 500 MG tablet Commonly known as: NAPROSYN Stopped by: Howard Pouch, DO     TAKE these medications   hydrOXYzine 10 MG tablet Commonly known as: ATARAX/VISTARIL Take 1-3  tablets (10-30 mg total) by mouth at bedtime. Started by: Howard Pouch, DO   metoprolol succinate 25 MG 24 hr tablet Commonly known as: TOPROL-XL Take 0.5 tablets (12.5 mg total) by mouth daily.       All past medical history, surgical history, allergies, family history, immunizations andmedications were updated in the EMR today and reviewed under the history and medication portions of their EMR.     MR ANGIO HEAD WO CONTRAST  Result Date: 08/21/2020 CLINICAL DATA:  Headache, new or worsening. EXAM: MRA HEAD WITHOUT CONTRAST TECHNIQUE: Angiographic images of the Circle of Willis were obtained using MRA technique without intravenous contrast. COMPARISON:  None. FINDINGS: Symmetric carotid and vertebral artery size and smooth normal contour. No visible posterior communicating arteries. No branch occlusion, stenosis, beading, or aneurysm. IMPRESSION: Negative intracranial MRA. Electronically Signed   By: Monte Fantasia M.D.   On: 08/21/2020 11:22   MR Brain W Wo Contrast  Result Date: 08/21/2020 CLINICAL DATA:  Headache, new or worsening EXAM: MRI HEAD WITHOUT AND WITH CONTRAST TECHNIQUE: Multiplanar, multiecho pulse sequences of the brain and surrounding structures were obtained without and with intravenous contrast. CONTRAST:  56m GADAVIST GADOBUTROL 1 MMOL/ML IV SOLN COMPARISON:  None. FINDINGS: Brain: No infarction, hemorrhage, hydrocephalus, extra-axial collection or mass lesion. No white matter disease or atrophy. No abnormal intracranial enhancement Vascular: Normal flow voids. Skull and upper cervical spine: Normal marrow signal. Sinuses/Orbits: Negative.  No sinusitis. IMPRESSION: Normal exam. Electronically Signed   By: JMonte FantasiaM.D.   On: 08/21/2020 11:20   MR CERVICAL SPINE W WO CONTRAST  Result Date: 08/21/2020 CLINICAL DATA:  Neck trauma with impaired range of motion. Persistent headaches and neck stiffness for 3 weeks. EXAM: MRI CERVICAL SPINE WITHOUT AND WITH CONTRAST  TECHNIQUE: Multiplanar and multiecho pulse sequences of the cervical spine, to include the craniocervical junction and cervicothoracic junction, were obtained without and with intravenous contrast. CONTRAST:  651mGADAVIST GADOBUTROL 1 MMOL/ML IV SOLN COMPARISON:  None. FINDINGS: Alignment: Normal Vertebrae: No fracture, evidence of discitis, or bone lesion. Cord: Normal signal and morphology. Posterior Fossa, vertebral arteries, paraspinal tissues: No evidence of mass or inflammation Disc levels: C2-3: Small central disc protrusion.  No impingement C3-4: Small central disc protrusion on sagittal images. No impingement C4-5: Minor annulus bulging C5-6: Minor annulus bulging with small left paracentral protrusion and tiny buttressing osteophytes C6-7: Small central protrusion suggested on sagittal images C7-T1:Unremarkable.  ROS: 14 pt review of systems performed and negative (unless mentioned in an HPI)  Objective: BP 131/79 (BP Location: Left Arm, Patient Position: Sitting, Cuff Size: Normal)   Pulse 86   Temp 98.2 F (36.8 C) (Oral)   Resp 15   Ht 5' 6.73" (1.695 m)   Wt 142 lb 3.2 oz (64.5 kg)  SpO2 99%   BMI 22.45 kg/m  Gen: Afebrile. No acute distress. Nontoxic in appearance, well-developed, well-nourished,  Very pleasant female.  HENT: AT. [place]. Bilateral TM visualized and normal in appearance, normal external auditory canal. MMM, no oral lesions, adequate dentition. Bilateral nares within normal limits. Throat without erythema, ulcerations or exudates. no Cough on exam, no hoarseness on exam. Eyes:Pupils Equal Round Reactive to light, Extraocular movements intact,  Conjunctiva without redness, discharge or icterus. Neck/lymp/endocrine: Supple, mildly enlarge submandibular glands.  no thyromegaly CV: RRR no murmur, no edema, +2/4 P posterior tibialis pulses. No JVD. Chest: CTAB, no wheeze, rhonchi or crackles.  Skin: no rashes, purpura or petechiae. Warm and well-perfused. Skin  intact. Neuro/Msk:  Normal gait. PERLA. EOMi. Alert. Oriented x3.  Cranial nerves II through XII intact. Muscle strength 5/5 upper/lower extremity. DTRs equal bilaterally. Psych: Normal affect, dress and demeanor. Normal speech. Normal thought content and judgment.   Assessment/plan: Lisa Kelley is a 59 y.o. female present for est care Polyarthralgia Review of prior records with negative TSH, ANA, lupus anticoagulant and rheumatoid factor. 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 -Consider trial gabapentin -Consider referral to neurology  Hypokalemia - Basic Metabolic Panel (BMET)   Return in about 4 weeks (around 09/23/2020) for CPE (30 min).  And follow-up on current conditions and further work-up.  Orders Placed This Encounter  Procedures  . B12  . Vitamin D (25 hydroxy)  . Basic Metabolic Panel (BMET)   Meds ordered this encounter  Medications  . hydrOXYzine (ATARAX/VISTARIL) 10 MG tablet    Sig: Take 1-3 tablets (10-30 mg total) by mouth at bedtime.    Dispense:  90 tablet    Refill:  5   Referral Orders  No referral(s) requested today     Note is dictated utilizing voice recognition software. Although note has been proof read prior to signing, occasional typographical errors still can be missed. If any questions arise, please do not hesitate to call for verification.  Electronically signed by: Howard Pouch, DO [place]

## 2020-08-27 LAB — BASIC METABOLIC PANEL
BUN: 12 mg/dL (ref 6–23)
CO2: 27 mEq/L (ref 19–32)
Calcium: 9.2 mg/dL (ref 8.4–10.5)
Chloride: 105 mEq/L (ref 96–112)
Creatinine, Ser: 0.82 mg/dL (ref 0.40–1.20)
GFR: 70.9 mL/min (ref >60.00)
Glucose, Bld: 82 mg/dL (ref 70–99)
Potassium: 3.7 mEq/L (ref 3.5–5.1)
Sodium: 141 mEq/L (ref 135–145)

## 2020-08-27 LAB — HM PAP SMEAR: HM Pap smear: NEGATIVE

## 2020-08-27 LAB — VITAMIN B12: Vitamin B-12: 372 pg/mL (ref 211–911)

## 2020-08-27 LAB — VITAMIN D 25 HYDROXY (VIT D DEFICIENCY, FRACTURES): VITD: 29.01 ng/mL — ABNORMAL LOW (ref 30.00–100.00)

## 2020-08-28 ENCOUNTER — Encounter: Payer: Self-pay | Admitting: Family Medicine

## 2020-08-29 NOTE — Progress Notes (Signed)
LVM for CB and MyChart message sent

## 2020-08-30 ENCOUNTER — Other Ambulatory Visit: Payer: Self-pay

## 2020-08-30 ENCOUNTER — Ambulatory Visit (HOSPITAL_COMMUNITY): Payer: 59 | Attending: Cardiology

## 2020-08-30 DIAGNOSIS — I471 Supraventricular tachycardia: Secondary | ICD-10-CM | POA: Insufficient documentation

## 2020-08-30 DIAGNOSIS — R232 Flushing: Secondary | ICD-10-CM | POA: Insufficient documentation

## 2020-08-30 DIAGNOSIS — E876 Hypokalemia: Secondary | ICD-10-CM | POA: Insufficient documentation

## 2020-08-30 DIAGNOSIS — M5030 Other cervical disc degeneration, unspecified cervical region: Secondary | ICD-10-CM | POA: Insufficient documentation

## 2020-08-30 DIAGNOSIS — M4820 Kissing spine, site unspecified: Secondary | ICD-10-CM | POA: Insufficient documentation

## 2020-08-30 DIAGNOSIS — M2550 Pain in unspecified joint: Secondary | ICD-10-CM | POA: Insufficient documentation

## 2020-08-30 DIAGNOSIS — R519 Headache, unspecified: Secondary | ICD-10-CM | POA: Insufficient documentation

## 2020-08-30 LAB — ECHOCARDIOGRAM COMPLETE
Area-P 1/2: 3.17 cm2
S' Lateral: 2.2 cm

## 2020-09-03 NOTE — Progress Notes (Signed)
Mammogram and pap received via fax and placed on pcp desk for review

## 2020-09-05 DIAGNOSIS — L821 Other seborrheic keratosis: Secondary | ICD-10-CM | POA: Diagnosis not present

## 2020-09-05 DIAGNOSIS — D485 Neoplasm of uncertain behavior of skin: Secondary | ICD-10-CM | POA: Diagnosis not present

## 2020-09-05 DIAGNOSIS — C44722 Squamous cell carcinoma of skin of right lower limb, including hip: Secondary | ICD-10-CM | POA: Diagnosis not present

## 2020-09-05 DIAGNOSIS — Z85828 Personal history of other malignant neoplasm of skin: Secondary | ICD-10-CM | POA: Diagnosis not present

## 2020-09-10 ENCOUNTER — Ambulatory Visit: Payer: 59 | Admitting: Family Medicine

## 2020-09-10 ENCOUNTER — Ambulatory Visit: Payer: 59 | Admitting: Internal Medicine

## 2020-09-11 ENCOUNTER — Ambulatory Visit: Payer: 59 | Admitting: Family Medicine

## 2020-09-16 DIAGNOSIS — N62 Hypertrophy of breast: Secondary | ICD-10-CM | POA: Diagnosis not present

## 2020-09-16 DIAGNOSIS — N6489 Other specified disorders of breast: Secondary | ICD-10-CM | POA: Diagnosis not present

## 2020-09-17 ENCOUNTER — Other Ambulatory Visit: Payer: Self-pay | Admitting: Family Medicine

## 2020-10-12 ENCOUNTER — Other Ambulatory Visit: Payer: Self-pay | Admitting: Family Medicine

## 2020-10-21 ENCOUNTER — Ambulatory Visit: Payer: 59 | Admitting: Interventional Cardiology

## 2020-10-22 ENCOUNTER — Ambulatory Visit: Payer: 59 | Admitting: Family Medicine

## 2020-12-15 ENCOUNTER — Other Ambulatory Visit: Payer: Self-pay | Admitting: Family Medicine

## 2021-04-23 DIAGNOSIS — H5213 Myopia, bilateral: Secondary | ICD-10-CM | POA: Diagnosis not present

## 2021-07-23 DIAGNOSIS — Z20822 Contact with and (suspected) exposure to covid-19: Secondary | ICD-10-CM | POA: Diagnosis not present

## 2021-11-12 DIAGNOSIS — L570 Actinic keratosis: Secondary | ICD-10-CM | POA: Diagnosis not present

## 2021-11-12 DIAGNOSIS — L821 Other seborrheic keratosis: Secondary | ICD-10-CM | POA: Diagnosis not present

## 2021-11-12 DIAGNOSIS — L820 Inflamed seborrheic keratosis: Secondary | ICD-10-CM | POA: Diagnosis not present

## 2021-11-12 DIAGNOSIS — D1801 Hemangioma of skin and subcutaneous tissue: Secondary | ICD-10-CM | POA: Diagnosis not present

## 2021-11-12 DIAGNOSIS — Z85828 Personal history of other malignant neoplasm of skin: Secondary | ICD-10-CM | POA: Diagnosis not present

## 2022-04-08 ENCOUNTER — Encounter: Payer: Self-pay | Admitting: Family Medicine

## 2022-04-08 ENCOUNTER — Ambulatory Visit (INDEPENDENT_AMBULATORY_CARE_PROVIDER_SITE_OTHER): Payer: 59 | Admitting: Family Medicine

## 2022-04-08 VITALS — BP 114/69 | HR 72 | Temp 98.8°F | Ht 67.25 in | Wt 137.0 lb

## 2022-04-08 DIAGNOSIS — E876 Hypokalemia: Secondary | ICD-10-CM | POA: Diagnosis not present

## 2022-04-08 DIAGNOSIS — Z0000 Encounter for general adult medical examination without abnormal findings: Secondary | ICD-10-CM | POA: Diagnosis not present

## 2022-04-08 DIAGNOSIS — Z131 Encounter for screening for diabetes mellitus: Secondary | ICD-10-CM

## 2022-04-08 DIAGNOSIS — R519 Headache, unspecified: Secondary | ICD-10-CM

## 2022-04-08 DIAGNOSIS — Z23 Encounter for immunization: Secondary | ICD-10-CM | POA: Diagnosis not present

## 2022-04-08 DIAGNOSIS — Z1159 Encounter for screening for other viral diseases: Secondary | ICD-10-CM | POA: Diagnosis not present

## 2022-04-08 DIAGNOSIS — Z5320 Procedure and treatment not carried out because of patient's decision for unspecified reasons: Secondary | ICD-10-CM

## 2022-04-08 DIAGNOSIS — Z1211 Encounter for screening for malignant neoplasm of colon: Secondary | ICD-10-CM

## 2022-04-08 DIAGNOSIS — Z1231 Encounter for screening mammogram for malignant neoplasm of breast: Secondary | ICD-10-CM | POA: Diagnosis not present

## 2022-04-08 DIAGNOSIS — Z130 Encounter for screening for diseases of the blood and blood-forming organs and certain disorders involving the immune mechanism: Secondary | ICD-10-CM | POA: Diagnosis not present

## 2022-04-08 DIAGNOSIS — E559 Vitamin D deficiency, unspecified: Secondary | ICD-10-CM

## 2022-04-08 DIAGNOSIS — Z13220 Encounter for screening for lipoid disorders: Secondary | ICD-10-CM

## 2022-04-08 DIAGNOSIS — Z800 Family history of malignant neoplasm of digestive organs: Secondary | ICD-10-CM

## 2022-04-08 DIAGNOSIS — E2839 Other primary ovarian failure: Secondary | ICD-10-CM | POA: Diagnosis not present

## 2022-04-08 LAB — COMPREHENSIVE METABOLIC PANEL
ALT: 12 U/L (ref 0–35)
AST: 17 U/L (ref 0–37)
Albumin: 4.6 g/dL (ref 3.5–5.2)
Alkaline Phosphatase: 66 U/L (ref 39–117)
BUN: 13 mg/dL (ref 6–23)
CO2: 29 mEq/L (ref 19–32)
Calcium: 9.2 mg/dL (ref 8.4–10.5)
Chloride: 105 mEq/L (ref 96–112)
Creatinine, Ser: 0.64 mg/dL (ref 0.40–1.20)
GFR: 94.66 mL/min (ref >60.00)
Glucose, Bld: 81 mg/dL (ref 70–99)
Potassium: 4.1 mEq/L (ref 3.5–5.1)
Sodium: 143 mEq/L (ref 135–145)
Total Bilirubin: 0.7 mg/dL (ref 0.2–1.2)
Total Protein: 6.6 g/dL (ref 6.0–8.3)

## 2022-04-08 LAB — CBC WITH DIFFERENTIAL/PLATELET
Basophils Absolute: 0 10*3/uL (ref 0.0–0.1)
Basophils Relative: 0.6 % (ref 0.0–3.0)
Eosinophils Absolute: 0.1 10*3/uL (ref 0.0–0.7)
Eosinophils Relative: 1.5 % (ref 0.0–5.0)
HCT: 38.4 % (ref 36.0–46.0)
Hemoglobin: 13.1 g/dL (ref 12.0–15.0)
Lymphocytes Relative: 18.8 % (ref 12.0–46.0)
Lymphs Abs: 1 10*3/uL (ref 0.7–4.0)
MCHC: 34.2 g/dL (ref 30.0–36.0)
MCV: 89.9 fl (ref 78.0–100.0)
Monocytes Absolute: 0.3 10*3/uL (ref 0.1–1.0)
Monocytes Relative: 5.4 % (ref 3.0–12.0)
Neutro Abs: 3.9 10*3/uL (ref 1.4–7.7)
Neutrophils Relative %: 73.7 % (ref 43.0–77.0)
Platelets: 283 10*3/uL (ref 150.0–400.0)
RBC: 4.27 Mil/uL (ref 3.87–5.11)
RDW: 13 % (ref 11.5–15.5)
WBC: 5.4 10*3/uL (ref 4.0–10.5)

## 2022-04-08 LAB — LIPID PANEL
Cholesterol: 184 mg/dL (ref 0–200)
HDL: 64.3 mg/dL (ref >39.00)
LDL Cholesterol: 108 mg/dL — ABNORMAL HIGH (ref 0–99)
NonHDL: 119.9
Total CHOL/HDL Ratio: 3
Triglycerides: 59 mg/dL (ref 0.0–149.0)
VLDL: 11.8 mg/dL (ref 0.0–40.0)

## 2022-04-08 LAB — HEMOGLOBIN A1C: Hgb A1c MFr Bld: 5.1 % (ref 4.6–6.5)

## 2022-04-08 LAB — VITAMIN D 25 HYDROXY (VIT D DEFICIENCY, FRACTURES): VITD: 31.59 ng/mL (ref 30.00–100.00)

## 2022-04-08 LAB — TSH: TSH: 2.75 u[IU]/mL (ref 0.35–5.50)

## 2022-04-08 NOTE — Progress Notes (Signed)
? ?This visit occurred during the SARS-CoV-2 public health emergency.  Safety protocols were in place, including screening questions prior to the visit, additional usage of staff PPE, and extensive cleaning of exam room while observing appropriate contact time as indicated for disinfecting solutions.  ? ? ?Patient ID: Lisa Kelley, female  DOB: 1961-07-26, 61 y.o.   MRN: [id number] ?Patient Care Team  ?  Relationship Specialty Notifications Start End  ?Ma Hillock, DO PCP - General Family Medicine  08/23/20   ?Maisie Fus, MD Consulting Physician Obstetrics and Gynecology  08/26/20   ?Ralene Bathe, MD  Ophthalmology  08/26/20   ?Jettie Booze, MD Consulting Physician Cardiology  08/26/20   ?Rolm Bookbinder, MD Consulting Physician Dermatology  08/26/20   ? ? ?Chief Complaint  ?Patient presents with  ? Annual Exam  ?  Pt is fasting  ? ? ?Subjective: ? ?Lisa Kelley is a 61 y.o.  Female  present for CPE  ?All past medical history, surgical history, allergies, family history, immunizations, medications and social history were updated in the electronic medical record today. ?All recent labs, ED visits and hospitalizations within the last year were reviewed. ? ?Health maintenance:  ?Colonoscopy: fhx present. never> referred to GI ?Mammogram: completed:08/2020- [facility] st> ordered ?Cervical cancer screening: last pap: 07/2020, Dr. Nori Riis (gyn) ?Immunizations: tdap updated today, Influenza (encouraged yearly), shingrix updated today #1 (nurse visit 2-6 mos for #2), covid completed ?Infectious disease screening: HIV declined, Hep C desired testing today ?DEXA: routine screen ?Assistive device: none ?Oxygen KZL:DJTT ?Patient has a Dental home. ?Hospitalizations/ED visits: reviewed ? ? ? ?  04/08/2022  ? 10:22 AM 04/08/2022  ? 10:19 AM 08/26/2020  ?  1:28 PM  ?Depression screen PHQ 2/9  ?Decreased Interest 0 0 1  ?Down, Depressed, Hopeless 0 0 0  ?PHQ - 2 Score 0 0 1  ? ?   ? View : No data to display.  ?  ?  ?   ? ? ?Immunization History  ?Administered Date(s) Administered  ? Influenza-Unspecified 09/13/2021  ? PFIZER(Purple Top)SARS-COV-2 Vaccination 12/29/2020  ? Tdap 04/08/2022  ? Zoster Recombinat (Shingrix) 04/08/2022  ? ?Past Medical History:  ?Diagnosis Date  ? Headache   ? Palpitations   ? PVC (premature ventricular contraction)   ? ?No Known Allergies ?Past Surgical History:  ?Procedure Laterality Date  ? BLEPHAROPLASTY  2018  ? BREAST REDUCTION SURGERY    ? CESAREAN SECTION  2004  ? MYOMECTOMY  2002  ? Fibroids; abdominal cerclage completed as well  ? RHINOPLASTY  1979  ? TONSILLECTOMY  1965  ? ?Family History  ?Problem Relation Age of Onset  ? Heart failure Mother   ? Hypertension Mother   ? Heart attack Mother   ? Diabetes Mother   ? Stroke Mother   ? Colon cancer Mother   ?     colon  ? Pancreatic cancer Father   ?     pancreatic  ? Prostate cancer Father   ? Breast cancer Maternal Grandmother   ? Prostate cancer Paternal Grandfather   ? Healthy Brother   ? Stroke Paternal Grandmother   ? Healthy Daughter   ? Healthy Brother   ? Healthy Brother   ? ?Social History  ? ?Social History Narrative  ? Marital status/children/pets: Married, 1 child  ? Education/employment: Associates degree, RN works at [employer]  ? Safety:   ?   -smoke alarm in the home:Yes  ?   - wears seatbelt:  Yes  ?   - Feels safe in their relationships: Yes  ? ? ?Allergies as of 04/08/2022   ?No Known Allergies ?  ? ?  ?Medication List  ?  ? ?  ? Accurate as of April 08, 2022 11:51 AM. If you have any questions, ask your nurse or doctor.  ?  ?  ? ?  ? ?STOP taking these medications   ? ?hydrOXYzine 10 MG tablet ?Commonly known as: ATARAX ?Stopped by: Howard Pouch, DO ?  ?metoprolol succinate 25 MG 24 hr tablet ?Commonly known as: TOPROL-XL ?Stopped by: Howard Pouch, DO ?  ? ?  ? ?TAKE these medications   ? ?multivitamin capsule ?Take 1 capsule by mouth daily. ?  ?vitamin C 500 MG tablet ?Commonly known as: ASCORBIC ACID ?Take  500 mg by mouth daily. ?  ? ?  ? ? ?All past medical history, surgical history, allergies, family history, immunizations andmedications were updated in the EMR today and reviewed under the history and medication portions of their EMR.    ? ?No results found for this or any previous visit (from the past 2160 hour(s)). ? ? ?ROS ?14 pt review of systems performed and negative (unless mentioned in an HPI) ? ?Objective: ?BP 114/69   Pulse 72   Temp 98.8 ?F (37.1 ?C) (Oral)   Ht 5' 7.25" (1.708 m)   Wt 137 lb (62.1 kg)   SpO2 99%   BMI 21.30 kg/m?  ?Physical Exam ?Vitals and nursing note reviewed.  ?Constitutional:   ?   General: She is not in acute distress. ?   Appearance: Normal appearance. She is not ill-appearing or toxic-appearing.  ?HENT:  ?   Head: Normocephalic and atraumatic.  ?   Right Ear: Tympanic membrane, ear canal and external ear normal. There is no impacted cerumen.  ?   Left Ear: Tympanic membrane, ear canal and external ear normal. There is no impacted cerumen.  ?   Nose: No congestion or rhinorrhea.  ?   Mouth/Throat:  ?   Mouth: Mucous membranes are moist.  ?   Pharynx: Oropharynx is clear. No oropharyngeal exudate or posterior oropharyngeal erythema.  ?Eyes:  ?   General: No scleral icterus.    ?   Right eye: No discharge.     ?   Left eye: No discharge.  ?   Extraocular Movements: Extraocular movements intact.  ?   Conjunctiva/sclera: Conjunctivae normal.  ?   Pupils: Pupils are equal, round, and reactive to light.  ?Cardiovascular:  ?   Rate and Rhythm: Normal rate and regular rhythm.  ?   Pulses: Normal pulses.  ?   Heart sounds: Normal heart sounds. No murmur heard. ?  No friction rub. No gallop.  ?Pulmonary:  ?   Effort: Pulmonary effort is normal. No respiratory distress.  ?   Breath sounds: Normal breath sounds. No stridor. No wheezing, rhonchi or rales.  ?Chest:  ?   Chest wall: No tenderness.  ?Abdominal:  ?   General: Abdomen is flat. Bowel sounds are normal. There is no distension.   ?   Palpations: Abdomen is soft. There is no mass.  ?   Tenderness: There is no abdominal tenderness. There is no right CVA tenderness, left CVA tenderness, guarding or rebound.  ?   Hernia: No hernia is present.  ?Musculoskeletal:     ?   General: No swelling, tenderness or deformity. Normal range of motion.  ?   Cervical back: Normal range of  motion and neck supple. No rigidity or tenderness.  ?   Right lower leg: No edema.  ?   Left lower leg: No edema.  ?Lymphadenopathy:  ?   Cervical: No cervical adenopathy.  ?Skin: ?   General: Skin is warm and dry.  ?   Coloration: Skin is not jaundiced or pale.  ?   Findings: No bruising, erythema, lesion or rash.  ?Neurological:  ?   General: No focal deficit present.  ?   Mental Status: She is alert and oriented to person, place, and time. Mental status is at baseline.  ?   Cranial Nerves: No cranial nerve deficit.  ?   Sensory: No sensory deficit.  ?   Motor: No weakness.  ?   Coordination: Coordination normal.  ?   Gait: Gait normal.  ?   Deep Tendon Reflexes: Reflexes normal.  ?Psychiatric:     ?   Mood and Affect: Mood normal.     ?   Behavior: Behavior normal.     ?   Thought Content: Thought content normal.     ?   Judgment: Judgment normal.  ?  ? ? ?No results found. ? ?Assessment/plan: ?PHILANA YOUNIS is a 61 y.o. female present for CPE  ?Persistent headaches ?- TSH ?Hypokalemia ?- Comp Met (CMET) ?Vitamin D deficiency ?- Vitamin D (25 hydroxy) ?- DG Bone Density; Future ?Estrogen deficiency ?- DG Bone Density; Future ?Breast cancer screening by mammogram ?- MM 3D SCREEN BREAST BILATERAL; Future ?Diabetes mellitus screening ?- Hemoglobin A1c ?Lipid screening ?- Lipid panel ?Screening for deficiency anemia ?- CBC w/Diff ?Need for Tdap vaccination ?- Tdap vaccine greater than or equal to 7yo IM ?Need for zoster vaccination ?- Varicella-zoster vaccine IM ?Colon cancer screening/FHX present ?- Ambulatory referral to Gastroenterology ?Encounter for hepatitis C screening  test for low risk patient ?- Hepatitis C antibody ?HIV screening declined ?Family history of colon cancer in mother ?- Ambulatory referral to Gastroenterology ?Routine general medical examination at a health car

## 2022-04-08 NOTE — Patient Instructions (Addendum)
?Great to see you today.  ?I have refilled the medication(s) we provide.  ? ?If labs were collected, we will inform you of lab results once received either by echart message or telephone call.  ? - echart message- for normal results that have been seen by the patient already.  ? - telephone call: abnormal results or if patient has not viewed results in their echart. ? ?Return in about 1 year (around 04/10/2023) for cpe (20 min) and 2-6 mos for nurse visit shingrix #2. ?And 2-6 mos nurse visit for shingrix #2 ? ? ?Health Maintenance, Female ?Adopting a healthy lifestyle and getting preventive care are important in promoting health and wellness. Ask your health care provider about: ?The right schedule for you to have regular tests and exams. ?Things you can do on your own to prevent diseases and keep yourself healthy. ?What should I know about diet, weight, and exercise? ?Eat a healthy diet ? ?Eat a diet that includes plenty of vegetables, fruits, low-fat dairy products, and lean protein. ?Do not eat a lot of foods that are high in solid fats, added sugars, or sodium. ?Maintain a healthy weight ?Body mass index (BMI) is used to identify weight problems. It estimates body fat based on height and weight. Your health care provider can help determine your BMI and help you achieve or maintain a healthy weight. ?Get regular exercise ?Get regular exercise. This is one of the most important things you can do for your health. Most adults should: ?Exercise for at least 150 minutes each week. The exercise should increase your heart rate and make you sweat (moderate-intensity exercise). ?Do strengthening exercises at least twice a week. This is in addition to the moderate-intensity exercise. ?Spend less time sitting. Even light physical activity can be beneficial. ?Watch cholesterol and blood lipids ?Have your blood tested for lipids and cholesterol at 63 years of age, then have this test every 5 years. ?Have your cholesterol  levels checked more often if: ?Your lipid or cholesterol levels are high. ?You are older than 63 years of age. ?You are at high risk for heart disease. ?What should I know about cancer screening? ?Depending on your health history and family history, you may need to have cancer screening at various ages. This may include screening for: ?Breast cancer. ?Cervical cancer. ?Colorectal cancer. ?Skin cancer. ?Lung cancer. ?What should I know about heart disease, diabetes, and high blood pressure? ?Blood pressure and heart disease ?High blood pressure causes heart disease and increases the risk of stroke. This is more likely to develop in people who have high blood pressure readings or are overweight. ?Have your blood pressure checked: ?Every 3-5 years if you are 62-41 years of age. ?Every year if you are 44 years old or older. ?Diabetes ?Have regular diabetes screenings. This checks your fasting blood sugar level. Have the screening done: ?Once every three years after age 25 if you are at a normal weight and have a low risk for diabetes. ?More often and at a younger age if you are overweight or have a high risk for diabetes. ?What should I know about preventing infection? ?Hepatitis B ?If you have a higher risk for hepatitis B, you should be screened for this virus. Talk with your health care provider to find out if you are at risk for hepatitis B infection. ?Hepatitis C ?Testing is recommended for: ?Everyone born from [birth year] through 1967. ?Anyone with known risk factors for hepatitis C. ?Sexually transmitted infections (STIs) ?Get screened for STIs, including  gonorrhea and chlamydia, if: ?You are sexually active and are younger than 63 years of age. ?You are older than 63 years of age and your health care provider tells you that you are at risk for this type of infection. ?Your sexual activity has changed since you were last screened, and you are at increased risk for chlamydia or gonorrhea. Ask your health care provider if  you are at risk. ?Ask your health care provider about whether you are at high risk for HIV. Your health care provider may recommend a prescription medicine to help prevent HIV infection. If you choose to take medicine to prevent HIV, you should first get tested for HIV. You should then be tested every 3 months for as long as you are taking the medicine. ?Pregnancy ?If you are about to stop having your period (premenopausal) and you may become pregnant, seek counseling before you get pregnant. ?Take 400 to 800 micrograms (mcg) of folic acid every day if you become pregnant. ?Ask for birth control (contraception) if you want to prevent pregnancy. ?Osteoporosis and menopause ?Osteoporosis is a disease in which the bones lose minerals and strength with aging. This can result in bone fractures. If you are 39 years old or older, or if you are at risk for osteoporosis and fractures, ask your health care provider if you should: ?Be screened for bone loss. ?Take a calcium or vitamin D supplement to lower your risk of fractures. ?Be given hormone replacement therapy (HRT) to treat symptoms of menopause. ?Follow these instructions at home: ?Alcohol use ?Do not drink alcohol if: ?Your health care provider tells you not to drink. ?You are pregnant, may be pregnant, or are planning to become pregnant. ?If you drink alcohol: ?Limit how much you have to: ?0-1 drink a day. ?Know how much alcohol is in your drink. In the [place] one drink equals one 12 oz bottle of beer (355 mL), one 5 oz glass of wine (148 mL), or one 1? oz glass of hard liquor (44 mL). ?Lifestyle ?Do not use any products that contain nicotine or tobacco. These products include cigarettes, chewing tobacco, and vaping devices, such as e-cigarettes. If you need help quitting, ask your health care provider. ?Do not use street drugs. ?Do not share needles. ?Ask your health care provider for help if you need support or information about quitting drugs. ?General  instructions ?Schedule regular health, dental, and eye exams. ?Stay current with your vaccines. ?Tell your health care provider if: ?You often feel depressed. ?You have ever been abused or do not feel safe at home. ?Summary ?Adopting a healthy lifestyle and getting preventive care are important in promoting health and wellness. ?Follow your health care provider's instructions about healthy diet, exercising, and getting tested or screened for diseases. ?Follow your health care provider's instructions on monitoring your cholesterol and blood pressure. ?This information is not intended to replace advice given to you by your health care provider. Make sure you discuss any questions you have with your health care provider. ?Document Revised: 04/21/2021 Document Reviewed: 04/21/2021 ?Elsevier Patient Education ? [place]. ? ?

## 2022-04-09 LAB — HEPATITIS C ANTIBODY
Hepatitis C Ab: NONREACTIVE
SIGNAL TO CUT-OFF: 0.06 (ref <1.00)

## 2022-07-15 ENCOUNTER — Ambulatory Visit (INDEPENDENT_AMBULATORY_CARE_PROVIDER_SITE_OTHER): Payer: 59

## 2022-07-15 DIAGNOSIS — Z23 Encounter for immunization: Secondary | ICD-10-CM | POA: Diagnosis not present

## 2022-08-05 ENCOUNTER — Other Ambulatory Visit: Payer: Self-pay

## 2022-08-05 ENCOUNTER — Ambulatory Visit (AMBULATORY_SURGERY_CENTER): Payer: Self-pay | Admitting: *Deleted

## 2022-08-05 VITALS — Ht 68.0 in | Wt 136.0 lb

## 2022-08-05 DIAGNOSIS — Z1211 Encounter for screening for malignant neoplasm of colon: Secondary | ICD-10-CM

## 2022-08-05 DIAGNOSIS — Z800 Family history of malignant neoplasm of digestive organs: Secondary | ICD-10-CM

## 2022-08-05 MED ORDER — NA SULFATE-K SULFATE-MG SULF 17.5-3.13-1.6 GM/177ML PO SOLN
1.0000 | Freq: Once | ORAL | 0 refills | Status: AC
  Filled 2022-08-05: qty 354, 1d supply, fill #0
  Filled 2022-08-06: qty 354, 2d supply, fill #0

## 2022-08-05 NOTE — Progress Notes (Signed)
Pre visit completed in person.  No egg or soy allergy known to patient  No issues known to pt with past sedation with any surgeries or procedures Patient denies ever being told they had issues or difficulty with intubation  No FH of Malignant Hyperthermia Pt is not on diet pills Pt is not on  home 02  Pt is not on blood thinners  Pt denies issues with constipation  No A fib or A flutter  Pt instructed to use Singlecare.com or GoodRx for a price reduction on prep   

## 2022-08-06 ENCOUNTER — Other Ambulatory Visit (HOSPITAL_COMMUNITY): Payer: Self-pay

## 2022-08-06 ENCOUNTER — Telehealth: Payer: Self-pay | Admitting: Gastroenterology

## 2022-08-06 NOTE — Telephone Encounter (Signed)
Inbound call from [facility] outpatient pharmacy in regards to the patient suprep. Pharmacy states insurance does not cover suprep and is inquiring if prep can be changed to something else. Please give a call back to further advise.  Thank you

## 2022-08-06 NOTE — Telephone Encounter (Signed)
Changed to Suprep generic per pharmacy this will be covered.

## 2022-08-21 ENCOUNTER — Ambulatory Visit (AMBULATORY_SURGERY_CENTER): Payer: 59 | Admitting: Gastroenterology

## 2022-08-21 ENCOUNTER — Encounter: Payer: Self-pay | Admitting: Gastroenterology

## 2022-08-21 VITALS — BP 103/69 | HR 60 | Temp 96.0°F | Resp 14 | Ht 67.25 in | Wt 136.0 lb

## 2022-08-21 DIAGNOSIS — Z1211 Encounter for screening for malignant neoplasm of colon: Secondary | ICD-10-CM | POA: Diagnosis not present

## 2022-08-21 DIAGNOSIS — K635 Polyp of colon: Secondary | ICD-10-CM | POA: Diagnosis not present

## 2022-08-21 DIAGNOSIS — D123 Benign neoplasm of transverse colon: Secondary | ICD-10-CM

## 2022-08-21 DIAGNOSIS — Z800 Family history of malignant neoplasm of digestive organs: Secondary | ICD-10-CM | POA: Diagnosis not present

## 2022-08-21 DIAGNOSIS — D120 Benign neoplasm of cecum: Secondary | ICD-10-CM

## 2022-08-21 MED ORDER — SODIUM CHLORIDE 0.9 % IV SOLN: 500.0000 mL | INTRAVENOUS | Status: AC

## 2022-08-21 NOTE — Progress Notes (Signed)
[facility] History and Physical   Primary Care Physician:  Ma Hillock, DO   Reason for Procedure:   FH of colon cancer - screening  Plan:    colonoscopy     HPI: Lisa Kelley is a 61 y.o. female  here for colonoscopy screening - first time exam. Patient denies any bowel symptoms at this time. Mother had colon cancer dx age 46.. Otherwise feels well without any cardiopulmonary symptoms.   I have discussed risks / benefits of anesthesia and endoscopic procedure with [name] and they wish to proceed with the exams as outlined today.    Past Medical History:  Diagnosis Date   Headache    Palpitations    PVC (premature ventricular contraction)     Past Surgical History:  Procedure Laterality Date   BLEPHAROPLASTY  2018   BREAST REDUCTION SURGERY     CESAREAN SECTION  2004   MYOMECTOMY  2002   Fibroids; abdominal cerclage completed as well   [facility]    Prior to Admission medications   Medication Sig Start Date End Date Taking? Authorizing Provider  Multiple Vitamin (MULTIVITAMIN) capsule Take 1 capsule by mouth daily.   Yes [provider]  vitamin C (ASCORBIC ACID) 500 MG tablet Take 500 mg by mouth daily.   Yes [provider]    Current Outpatient Medications  Medication Sig Dispense Refill   Multiple Vitamin (MULTIVITAMIN) capsule Take 1 capsule by mouth daily.     vitamin C (ASCORBIC ACID) 500 MG tablet Take 500 mg by mouth daily.     Current Facility-Administered Medications  Medication Dose Route Frequency Provider Last Rate Last Admin   0.9 %  sodium chloride infusion  500 mL Intravenous Continuous [name], [name], MD        Allergies as of 08/21/2022   (No Known Allergies)    Family History  Problem Relation Age of Onset   Heart failure Mother    Hypertension Mother    Heart attack Mother    Diabetes Mother    Stroke Mother    Colon cancer Mother        colon   Pancreatic  cancer Father        pancreatic   Prostate cancer Father    Healthy Brother    Healthy Brother    Healthy Brother    Breast cancer Maternal Grandmother    Stroke Paternal Grandmother    Prostate cancer Paternal Grandfather    Healthy Daughter    Esophageal cancer Neg Hx    Stomach cancer Neg Hx    Rectal cancer Neg Hx     Social History   Socioeconomic History   Marital status: Married    Spouse name: Not on file   Number of children: Not on file   Years of education: Not on file   Highest education level: Not on file  Occupational History   Not on file  Tobacco Use   Smoking status: Never   Smokeless tobacco: Never  Vaping Use   Vaping Use: Never used  Substance and Sexual Activity   Alcohol use: Yes    Alcohol/week: 2.0 standard drinks of alcohol    Types: 2 Glasses of wine per week    Comment: socially   Drug use: Never   Sexual activity: Yes    Partners: Male  Other Topics Concern   Not on file  Social History Narrative   Marital status/children/pets:  Married, 1 child   Education/employment: Geophysicist/field seismologist, RN works at [employer]   Safety:      -smoke alarm in the home:Yes     - wears seatbelt: Yes     - Feels safe in their relationships: Yes   Social Determinants of Health   Financial Resource Strain: Not on file  Food Insecurity: Not on file  Transportation Needs: Not on file  Physical Activity: Not on file  Stress: Not on file  Social Connections: Not on file  Intimate Partner Violence: Not on file    Review of Systems: All other review of systems negative except as mentioned in the HPI.  Physical Exam: Vital signs BP 135/78   Pulse 71   Temp (!) 96 F (35.6 C)   Resp 13   Ht 5' 7.25" (1.708 m)   Wt 136 lb (61.7 kg)   SpO2 100%   BMI 21.14 kg/m   General:   Alert,  Well-developed, pleasant and cooperative in NAD Lungs:  Clear throughout to auscultation.   Heart:  Regular rate and rhythm Abdomen:  Soft, nontender  and nondistended.   Neuro/Psych:  Alert and cooperative. Normal mood and affect. A and O x 3  [name], MD [facility]

## 2022-08-21 NOTE — Op Note (Signed)
[place] Patient Name: Lisa Kelley Procedure Date: 08/21/2022 7:42 AM MRN: [id number] Endoscopist: Remo Lipps P. Havery Moros , MD Age: 61 Referring MD:  Date of Birth: 1961-05-03 Gender: Female Account #: [id number] Procedure:                Colonoscopy Indications:              Screening patient at increased risk: Family history                            of 1st-degree relative with colorectal cancer                            (mother diagnosed age 42) - first colonoscopy Medicines:                Monitored Anesthesia Care Procedure:                Pre-Anesthesia Assessment:                           - Prior to the procedure, a History and Physical                            was performed, and patient medications and                            allergies were reviewed. The patient's tolerance of                            previous anesthesia was also reviewed. The risks                            and benefits of the procedure and the sedation                            options and risks were discussed with the patient.                            All questions were answered, and informed consent                            was obtained. Prior Anticoagulants: The patient has                            taken no previous anticoagulant or antiplatelet                            agents. ASA Grade Assessment: I - A normal, healthy                            patient. After reviewing the risks and benefits,                            the patient was deemed in satisfactory condition to  undergo the procedure.                           After obtaining informed consent, the colonoscope                            was passed under direct vision. Throughout the                            procedure, the patient's blood pressure, pulse, and                            oxygen saturations were monitored continuously. The                            PCF-HQ190L Colonoscope  was introduced through the                            anus and advanced to the the cecum, identified by                            appendiceal orifice and ileocecal valve. The                            colonoscopy was performed without difficulty. The                            patient tolerated the procedure well. The quality                            of the bowel preparation was good. The ileocecal                            valve, appendiceal orifice, and rectum were                            photographed. Scope In: 7:57:47 AM Scope Out: 8:20:39 AM Scope Withdrawal Time: 0 hours 14 minutes 2 seconds  Total Procedure Duration: 0 hours 22 minutes 52 seconds  Findings:                 Skin tags were found on perianal exam.                           A diminutive polyp was found in the cecum. The                            polyp was flat. The polyp was removed with a cold                            snare. Resection and retrieval were complete.                           A 8 mm polyp was found in the transverse colon. The  polyp was flat. The polyp was removed with a cold                            snare. Resection and retrieval were complete.                           Multiple small-mouthed diverticula were found in                            the transverse colon and left colon.                           Internal hemorrhoids were found during                            retroflexion. The hemorrhoids were small.                           Anal papilla(e) were hypertrophied.                           The exam was otherwise without abnormality. Complications:            No immediate complications. Estimated blood loss:                            Minimal. Estimated Blood Loss:     Estimated blood loss was minimal. Impression:               - Perianal skin tags found on perianal exam.                           - One diminutive polyp in the cecum, removed with a                             cold snare. Resected and retrieved.                           - One 8 mm polyp in the transverse colon, removed                            with a cold snare. Resected and retrieved.                           - Diverticulosis in the transverse colon and in the                            left colon.                           - Internal hemorrhoids.                           - Anal papilla(e) were hypertrophied.                           -  The examination was otherwise normal. Recommendation:           - Patient has a contact number available for                            emergencies. The signs and symptoms of potential                            delayed complications were discussed with the                            patient. Return to normal activities tomorrow.                            Written discharge instructions were provided to the                            patient.                           - Resume previous diet.                           - Continue present medications.                           - Await pathology results. Remo Lipps P. Gay Moncivais, MD 08/21/2022 8:25:17 AM This report has been signed electronically.

## 2022-08-21 NOTE — Progress Notes (Signed)
Report to PACU, RN, vss, BBS= Clear.  

## 2022-08-21 NOTE — Progress Notes (Signed)
Called to room to assist during endoscopic procedure.  Patient ID and intended procedure confirmed with present staff. Received instructions for my participation in the procedure from the performing physician.  

## 2022-08-21 NOTE — Patient Instructions (Signed)
YOU HAD AN ENDOSCOPIC PROCEDURE TODAY AT [facility]:   Refer to the procedure report that was given to you for any specific questions about what was found during the examination.  If the procedure report does not answer your questions, please call your gastroenterologist to clarify.  If you requested that your care partner not be given the details of your procedure findings, then the procedure report has been included in a sealed envelope for you to review at your convenience later.  YOU SHOULD EXPECT: Some feelings of bloating in the abdomen. Passage of more gas than usual.  Walking can help get rid of the air that was put into your GI tract during the procedure and reduce the bloating. If you had a lower endoscopy (such as a colonoscopy or flexible sigmoidoscopy) you may notice spotting of blood in your stool or on the toilet paper. If you underwent a bowel prep for your procedure, you may not have a normal bowel movement for a few days.  Please Note:  You might notice some irritation and congestion in your nose or some drainage.  This is from the oxygen used during your procedure.  There is no need for concern and it should clear up in a day or so.  SYMPTOMS TO REPORT IMMEDIATELY:  Following lower endoscopy (colonoscopy or flexible sigmoidoscopy):  Excessive amounts of blood in the stool  Significant tenderness or worsening of abdominal pains  Swelling of the abdomen that is new, acute  Fever of 100F or higher   For urgent or emergent issues, a gastroenterologist can be reached at any hour by calling [phone]. Do not use MyChart messaging for urgent concerns.    DIET:  We do recommend a small meal at first, but then you may proceed to your regular diet.  Drink plenty of fluids but you should avoid alcoholic beverages for 24 hours.  MEDICATIONS: Continue present medications.  Please see handouts given to you by your recovery nurse.  Thank you for allowing [place] to  provide for your healthcare needs today.  ACTIVITY:  You should plan to take it easy for the rest of today and you should NOT DRIVE or use heavy machinery until tomorrow (because of the sedation medicines used during the test).    FOLLOW UP: Our staff will call the number listed on your records the next business day following your procedure.  We will call around 7:15- 8:00 am to check on you and address any questions or concerns that you may have regarding the information given to you following your procedure. If we do not reach you, we will leave a message.  If you develop any symptoms (ie: fever, flu-like symptoms, shortness of breath, cough etc.) before then, please call [phone].  If you test positive for Covid 19 in the 2 weeks post procedure, please call and report this information to [place].    If any biopsies were taken you will be contacted by phone or by letter within the next 1-3 weeks.  Please call us at [phone] if you have not heard about the biopsies in 3 weeks.    SIGNATURES/CONFIDENTIALITY: You and/or your care partner have signed paperwork which will be entered into your electronic medical record.  These signatures attest to the fact that that the information above on your After Visit Summary has been reviewed and is understood.  Full responsibility of the confidentiality of this discharge information lies with you and/or your care-partner.

## 2022-08-24 ENCOUNTER — Telehealth: Payer: Self-pay

## 2022-08-24 NOTE — Telephone Encounter (Signed)
Unable to leave message mailbox is full.

## 2022-09-26 DIAGNOSIS — R509 Fever, unspecified: Secondary | ICD-10-CM | POA: Diagnosis not present

## 2022-09-26 DIAGNOSIS — U071 COVID-19: Secondary | ICD-10-CM | POA: Diagnosis not present

## 2022-10-13 DIAGNOSIS — L578 Other skin changes due to chronic exposure to nonionizing radiation: Secondary | ICD-10-CM | POA: Diagnosis not present

## 2022-10-13 DIAGNOSIS — Z85828 Personal history of other malignant neoplasm of skin: Secondary | ICD-10-CM | POA: Diagnosis not present

## 2022-10-13 DIAGNOSIS — D1801 Hemangioma of skin and subcutaneous tissue: Secondary | ICD-10-CM | POA: Diagnosis not present

## 2022-10-13 DIAGNOSIS — L570 Actinic keratosis: Secondary | ICD-10-CM | POA: Diagnosis not present

## 2022-10-13 DIAGNOSIS — L821 Other seborrheic keratosis: Secondary | ICD-10-CM | POA: Diagnosis not present

## 2022-10-13 DIAGNOSIS — L820 Inflamed seborrheic keratosis: Secondary | ICD-10-CM | POA: Diagnosis not present

## 2022-10-13 DIAGNOSIS — D485 Neoplasm of uncertain behavior of skin: Secondary | ICD-10-CM | POA: Diagnosis not present

## 2023-01-26 ENCOUNTER — Other Ambulatory Visit: Payer: Self-pay | Admitting: Family Medicine

## 2023-01-26 DIAGNOSIS — Z1231 Encounter for screening mammogram for malignant neoplasm of breast: Secondary | ICD-10-CM

## 2023-04-28 DIAGNOSIS — H5213 Myopia, bilateral: Secondary | ICD-10-CM | POA: Diagnosis not present

## 2023-06-15 ENCOUNTER — Other Ambulatory Visit (HOSPITAL_COMMUNITY): Payer: Self-pay

## 2023-06-15 DIAGNOSIS — L218 Other seborrheic dermatitis: Secondary | ICD-10-CM | POA: Diagnosis not present

## 2023-06-15 DIAGNOSIS — Z85828 Personal history of other malignant neoplasm of skin: Secondary | ICD-10-CM | POA: Diagnosis not present

## 2023-06-15 DIAGNOSIS — D485 Neoplasm of uncertain behavior of skin: Secondary | ICD-10-CM | POA: Diagnosis not present

## 2023-06-15 DIAGNOSIS — C44722 Squamous cell carcinoma of skin of right lower limb, including hip: Secondary | ICD-10-CM | POA: Diagnosis not present

## 2023-06-15 DIAGNOSIS — D0462 Carcinoma in situ of skin of left upper limb, including shoulder: Secondary | ICD-10-CM | POA: Diagnosis not present

## 2023-06-15 DIAGNOSIS — L821 Other seborrheic keratosis: Secondary | ICD-10-CM | POA: Diagnosis not present

## 2023-06-15 DIAGNOSIS — D0439 Carcinoma in situ of skin of other parts of face: Secondary | ICD-10-CM | POA: Diagnosis not present

## 2023-06-15 DIAGNOSIS — B0089 Other herpesviral infection: Secondary | ICD-10-CM | POA: Diagnosis not present

## 2023-06-15 DIAGNOSIS — L570 Actinic keratosis: Secondary | ICD-10-CM | POA: Diagnosis not present

## 2023-06-15 MED ORDER — FLUOCINONIDE 0.05 % EX SOLN
1.0000 | Freq: Two times a day (BID) | CUTANEOUS | 0 refills | Status: DC
Start: 2023-06-15 — End: 2023-10-05
  Filled 2023-06-15: qty 20, 1d supply, fill #0

## 2023-06-15 MED ORDER — VALACYCLOVIR HCL 1 G PO TABS
2.0000 g | ORAL_TABLET | Freq: Two times a day (BID) | ORAL | 1 refills | Status: AC
  Filled 2023-06-15: qty 12, 3d supply, fill #0
  Filled 2023-10-05: qty 12, 3d supply, fill #1

## 2023-06-16 ENCOUNTER — Other Ambulatory Visit (HOSPITAL_COMMUNITY): Payer: Self-pay

## 2023-07-21 ENCOUNTER — Other Ambulatory Visit (HOSPITAL_COMMUNITY): Payer: Self-pay

## 2023-07-21 DIAGNOSIS — D0462 Carcinoma in situ of skin of left upper limb, including shoulder: Secondary | ICD-10-CM | POA: Diagnosis not present

## 2023-07-21 DIAGNOSIS — D0439 Carcinoma in situ of skin of other parts of face: Secondary | ICD-10-CM | POA: Diagnosis not present

## 2023-07-21 DIAGNOSIS — Z85828 Personal history of other malignant neoplasm of skin: Secondary | ICD-10-CM | POA: Diagnosis not present

## 2023-07-21 MED ORDER — IMIQUIMOD 5 % EX CREA
TOPICAL_CREAM | CUTANEOUS | 1 refills | Status: AC
  Filled 2023-07-21: qty 24, 12d supply, fill #0

## 2023-07-22 ENCOUNTER — Other Ambulatory Visit (HOSPITAL_COMMUNITY): Payer: Self-pay

## 2023-08-02 ENCOUNTER — Ambulatory Visit: Payer: Commercial Managed Care - PPO | Admitting: Physician Assistant

## 2023-08-08 NOTE — Progress Notes (Unsigned)
Cardiology Office Note:  .   Date:  08/09/2023  ID:  Lisa Kelley, DOB 1961/11/22, MRN [id number] PCP: Natalia Leatherwood, DO  [facility] Providers Cardiologist:  None {   History of Present Illness: .   Lisa Kelley is a 62 y.o. female with a past medical history of headache, palpitations, and PVCs here for overdue follow-up appointment.  Was last seen 3 years ago 08/20/2020 by Dr. Eldridge Dace at that time was being seen for palpitations.  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  A few weeks prior she has been palpitations felt like maybe PVCs.  She had some headaches and HR availability noted at work sometimes.  Headaches also started in the back of her neck.  Finally, she went to the ER on 08/14/2020 due to persistent headache and dizziness, fatigue.  Missed work for the first time in 20 years that day.  She typically exercises and feels well.  Walks 4 to 6 miles usually and feels fine with that level of exercise.  At that time she denied chest pain, leg edema, nitroglycerin use, orthopnea, PND, shortness of breath, and syncope.  Today, she tells me that she has not been here in 3 years.  She has been feeling pretty good.  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  She has not had any further palpitations since we last saw her here in the office.  She did get a primary care.  Luckily, her headaches and cluster of odd symptoms have resolved.  She was also having neck pain at 1 point which is gotten better for the most part.  No chest pain or shortness of breath.  We have updated some lab work today.  She tells me that she is on the [place] due to some skin cancer found on her left arm.  A biopsy was done by her PCP.  Reports no shortness of breath nor dyspnea on exertion. Reports no chest pain, pressure, or tightness. No edema, orthopnea, PND. Reports no palpitations.    ROS: Pertinent ROS in HPI  Studies Reviewed: .       Echo 08/30/20 IMPRESSIONS     1. Left  ventricular ejection fraction, by estimation, is 65 to 70%. The  left ventricle has normal function. The left ventricle has no regional  wall motion abnormalities. Left ventricular diastolic parameters are  consistent with Grade I diastolic  dysfunction (impaired relaxation). The average left ventricular global  longitudinal strain is -22.6 %. The global longitudinal strain is normal.   2. Right ventricular systolic function is normal. The right ventricular  size is normal.   3. The mitral valve is normal in structure. No evidence of mitral valve  regurgitation. No evidence of mitral stenosis.   4. The aortic valve is normal in structure. Aortic valve regurgitation is  not visualized. No aortic stenosis is present.   5. Aortic dilatation noted. There is borderline dilatation of the  ascending aorta, measuring 37 mm.   6. The inferior vena cava is normal in size with greater than 50%  respiratory variability, suggesting right atrial pressure of 3 mmHg.   FINDINGS   Left Ventricle: Left ventricular ejection fraction, by estimation, is 65  to 70%. The left ventricle has normal function. The left ventricle has no  regional wall motion abnormalities. The average left ventricular global  longitudinal strain is -22.6 %.  The global longitudinal strain is normal. The left ventricular internal  cavity size was  normal in size. There is no left ventricular hypertrophy.  Left ventricular diastolic parameters are consistent with Grade I  diastolic dysfunction (impaired  relaxation). Normal left ventricular filling pressure.   Right Ventricle: The right ventricular size is normal. No increase in  right ventricular wall thickness. Right ventricular systolic function is  normal.   Left Atrium: Left atrial size was normal in size.   Right Atrium: Right atrial size was normal in size.   Pericardium: There is no evidence of pericardial effusion.   Mitral Valve: The mitral valve is normal in  structure. No evidence of  mitral valve regurgitation. No evidence of mitral valve stenosis.   Tricuspid Valve: The tricuspid valve is normal in structure. Tricuspid  valve regurgitation is trivial. No evidence of tricuspid stenosis.   Aortic Valve: The aortic valve is normal in structure. Aortic valve  regurgitation is not visualized. No aortic stenosis is present.   Pulmonic Valve: The pulmonic valve was normal in structure. Pulmonic valve  regurgitation is not visualized. No evidence of pulmonic stenosis.   Aorta: Aortic dilatation noted. There is borderline dilatation of the  ascending aorta, measuring 37 mm.   Venous: The inferior vena cava is normal in size with greater than 50%  respiratory variability, suggesting right atrial pressure of 3 mmHg.   IAS/Shunts: The interatrial septum appears to be lipomatous. No atrial  level shunt detected by color flow Doppler.       Physical Exam:   VS:  BP 120/78   Pulse 70   Ht 5\' 8"  (1.727 m)   Wt 149 lb 6.4 oz (67.8 kg)   SpO2 96%   BMI 22.72 kg/m    Wt Readings from Last 3 Encounters:  08/09/23 149 lb 6.4 oz (67.8 kg)  08/21/22 136 lb (61.7 kg)  08/05/22 136 lb (61.7 kg)    GEN: Well nourished, well developed in no acute distress NECK: No JVD; No carotid bruits CARDIAC: RRR, no murmurs, rubs, gallops RESPIRATORY:  Clear to auscultation without rales, wheezing or rhonchi  ABDOMEN: Soft, non-tender, non-distended EXTREMITIES:  No edema; No deformity   ASSESSMENT AND PLAN: .   1.  SVT/Palpitations -resolved, happened for a few weeks and has not re-occurred -will update lab work -continue current medications  2.  Headaches -resolved  3.  Neck pain -resolved     Dispo: She can follow-up with Dr. Lynnette Caffey in a year  Signed, Sharlene Dory, PA-C

## 2023-08-09 ENCOUNTER — Encounter: Payer: Self-pay | Admitting: Physician Assistant

## 2023-08-09 ENCOUNTER — Ambulatory Visit: Payer: Commercial Managed Care - PPO | Attending: Physician Assistant | Admitting: Physician Assistant

## 2023-08-09 VITALS — BP 120/78 | HR 70 | Ht 68.0 in | Wt 149.4 lb

## 2023-08-09 DIAGNOSIS — I471 Supraventricular tachycardia, unspecified: Secondary | ICD-10-CM | POA: Diagnosis not present

## 2023-08-09 DIAGNOSIS — R519 Headache, unspecified: Secondary | ICD-10-CM | POA: Diagnosis not present

## 2023-08-09 DIAGNOSIS — M542 Cervicalgia: Secondary | ICD-10-CM | POA: Diagnosis not present

## 2023-08-09 DIAGNOSIS — R002 Palpitations: Secondary | ICD-10-CM | POA: Diagnosis not present

## 2023-08-09 NOTE — Patient Instructions (Signed)
Medication Instructions:  Your physician recommends that you continue on your current medications as directed. Please refer to the Current Medication list given to you today.  *If you need a refill on your cardiac medications before your next appointment, please call your pharmacy*  Lab Work: Schedule lipids, lfts, BNP, CBC-for a day that you can come fasting If you have labs (blood work) drawn today and your tests are completely normal, you will receive your results only by: MyChart Message (if you have MyChart) OR A paper copy in the mail If you have any lab test that is abnormal or we need to change your treatment, we will call you to review the results.  Follow-Up: At [facility], you and your health needs are our priority.  As part of our continuing mission to provide you with exceptional heart care, we have created designated Provider Care Teams.  These Care Teams include your primary Cardiologist (physician) and Advanced Practice Providers (APPs -  Physician Assistants and Nurse Practitioners) who all work together to provide you with the care you need, when you need it.  Your next appointment:   1 year(s)  Provider:   Dr Lynnette Caffey   Heart-Healthy Eating Plan Many factors influence your heart health, including eating and exercise habits. Heart health is also called coronary health. Coronary risk increases with abnormal blood fat (lipid) levels. A heart-healthy eating plan includes limiting unhealthy fats, increasing healthy fats, limiting salt (sodium) intake, and making other diet and lifestyle changes. What is my plan? Your health care provider may recommend that: You limit your fat intake to _________% or less of your total calories each day. You limit your saturated fat intake to _________% or less of your total calories each day. You limit the amount of cholesterol in your diet to less than _________ mg per day. You limit the amount of sodium in your diet to less than  _________ mg per day. What are tips for following this plan? Cooking Cook foods using methods other than frying. Baking, boiling, grilling, and broiling are all good options. Other ways to reduce fat include: Removing the skin from poultry. Removing all visible fats from meats. Steaming vegetables in water or broth. Meal planning  At meals, imagine dividing your plate into fourths: Fill one-half of your plate with vegetables and green salads. Fill one-fourth of your plate with whole grains. Fill one-fourth of your plate with lean protein foods. Eat 2-4 cups of vegetables per day. One cup of vegetables equals 1 cup (91 g) broccoli or cauliflower florets, 2 medium carrots, 1 large bell pepper, 1 large sweet potato, 1 large tomato, 1 medium white potato, 2 cups (150 g) raw leafy greens. Eat 1-2 cups of fruit per day. One cup of fruit equals 1 small apple, 1 large banana, 1 cup (237 g) mixed fruit, 1 large orange,  cup (82 g) dried fruit, 1 cup (240 mL) 100% fruit juice. Eat more foods that contain soluble fiber. Examples include apples, broccoli, carrots, beans, peas, and barley. Aim to get 25-30 g of fiber per day. Increase your consumption of legumes, nuts, and seeds to 4-5 servings per week. One serving of dried beans or legumes equals  cup (90 g) cooked, 1 serving of nuts is  oz (12 almonds, 24 pistachios, or 7 walnut halves), and 1 serving of seeds equals  oz (8 g). Fats Choose healthy fats more often. Choose monounsaturated and polyunsaturated fats, such as olive and canola oils, avocado oil, flaxseeds, walnuts, almonds,  and seeds. Eat more omega-3 fats. Choose salmon, mackerel, sardines, tuna, flaxseed oil, and ground flaxseeds. Aim to eat fish at least 2 times each week. Check food labels carefully to identify foods with trans fats or high amounts of saturated fat. Limit saturated fats. These are found in animal products, such as meats, butter, and cream. Plant sources of saturated  fats include palm oil, palm kernel oil, and coconut oil. Avoid foods with partially hydrogenated oils in them. These contain trans fats. Examples are stick margarine, some tub margarines, cookies, crackers, and other baked goods. Avoid fried foods. General information Eat more home-cooked food and less restaurant, buffet, and fast food. Limit or avoid alcohol. Limit foods that are high in added sugar and simple starches such as foods made using white refined flour (white breads, pastries, sweets). Lose weight if you are overweight. Losing just 5-10% of your body weight can help your overall health and prevent diseases such as diabetes and heart disease. Monitor your sodium intake, especially if you have high blood pressure. Talk with your health care provider about your sodium intake. Try to incorporate more vegetarian meals weekly. What foods should I eat? Fruits All fresh, canned (in natural juice), or frozen fruits. Vegetables Fresh or frozen vegetables (raw, steamed, roasted, or grilled). Green salads. Grains Most grains. Choose whole wheat and whole grains most of the time. Rice and pasta, including brown rice and pastas made with whole wheat. Meats and other proteins Lean, well-trimmed beef, veal, pork, and lamb. Chicken and [place] without skin. All fish and shellfish. Wild duck, rabbit, pheasant, and venison. Egg whites or low-cholesterol egg substitutes. Dried beans, peas, lentils, and tofu. Seeds and most nuts. Dairy Low-fat or nonfat cheeses, including ricotta and mozzarella. Skim or 1% milk (liquid, powdered, or evaporated). Buttermilk made with low-fat milk. Nonfat or low-fat yogurt. Fats and oils Non-hydrogenated (trans-free) margarines. Vegetable oils, including soybean, sesame, sunflower, olive, avocado, peanut, safflower, corn, canola, and cottonseed. Salad dressings or mayonnaise made with a vegetable oil. Beverages Water (mineral or sparkling). Coffee and tea. Unsweetened  ice tea. Diet beverages. Sweets and desserts Sherbet, gelatin, and fruit ice. Small amounts of dark chocolate. Limit all sweets and desserts. Seasonings and condiments All seasonings and condiments. The items listed above may not be a complete list of foods and beverages you can eat. Contact a dietitian for more options. What foods should I avoid? Fruits Canned fruit in heavy syrup. Fruit in cream or butter sauce. Fried fruit. Limit coconut. Vegetables Vegetables cooked in cheese, cream, or butter sauce. Fried vegetables. Grains Breads made with saturated or trans fats, oils, or whole milk. Croissants. Sweet rolls. Donuts. High-fat crackers, such as cheese crackers and chips. Meats and other proteins Fatty meats, such as hot dogs, ribs, sausage, bacon, rib-eye roast or steak. High-fat deli meats, such as salami and bologna. Caviar. Domestic duck and goose. Organ meats, such as liver. Dairy Cream, sour cream, cream cheese, and creamed cottage cheese. Whole-milk cheeses. Whole or 2% milk (liquid, evaporated, or condensed). Whole buttermilk. Cream sauce or high-fat cheese sauce. Whole-milk yogurt. Fats and oils Meat fat, or shortening. Cocoa butter, hydrogenated oils, palm oil, coconut oil, palm kernel oil. Solid fats and shortenings, including bacon fat, salt pork, lard, and butter. Nondairy cream substitutes. Salad dressings with cheese or sour cream. Beverages Regular sodas and any drinks with added sugar. Sweets and desserts Frosting. Pudding. Cookies. Cakes. Pies. Milk chocolate or white chocolate. Buttered syrups. Full-fat ice cream or ice cream drinks. The items  listed above may not be a complete list of foods and beverages to avoid. Contact a dietitian for more information. Summary Heart-healthy meal planning includes limiting unhealthy fats, increasing healthy fats, limiting salt (sodium) intake and making other diet and lifestyle changes. Lose weight if you are overweight. Losing  just 5-10% of your body weight can help your overall health and prevent diseases such as diabetes and heart disease. Focus on eating a balance of foods, including fruits and vegetables, low-fat or nonfat dairy, lean protein, nuts and legumes, whole grains, and heart-healthy oils and fats. This information is not intended to replace advice given to you by your health care provider. Make sure you discuss any questions you have with your health care provider. Document Revised: 01/05/2022 Document Reviewed: 01/05/2022 Elsevier Patient Education  2024 [employer] Low-Sodium Eating Plan Salt (sodium) helps you keep a healthy balance of fluids in your body. Too much sodium can raise your blood pressure. It can also cause fluid and waste to be held in your body. Your health care provider or dietitian may recommend a low-sodium eating plan if you have high blood pressure (hypertension), kidney disease, liver disease, or heart failure. Eating less sodium can help lower your blood pressure and reduce swelling. It can also protect your heart, liver, and kidneys. What are tips for following this plan? Reading food labels  Check food labels for the amount of sodium per serving. If you eat more than one serving, you must multiply the listed amount by the number of servings. Choose foods with less than 140 milligrams (mg) of sodium per serving. Avoid foods with 300 mg of sodium or more per serving. Always check how much sodium is in a product, even if the label says "unsalted" or "no salt added." Shopping  Buy products labeled as "low-sodium" or "no salt added." Buy fresh foods. Avoid canned foods and pre-made or frozen meals. Avoid canned, cured, or processed meats. Buy breads that have less than 80 mg of sodium per slice. Cooking  Eat more home-cooked food. Try to eat less restaurant, buffet, and fast food. Try not to add salt when you cook. Use salt-free seasonings or herbs instead of table salt or  sea salt. Check with your provider or pharmacist before using salt substitutes. Cook with plant-based oils, such as canola, sunflower, or olive oil. Meal planning When eating at a restaurant, ask if your food can be made with less salt or no salt. Avoid dishes labeled as brined, pickled, cured, or smoked. Avoid dishes made with soy sauce, miso, or teriyaki sauce. Avoid foods that have monosodium glutamate (MSG) in them. MSG may be added to some restaurant food, sauces, soups, bouillon, and canned foods. Make meals that can be grilled, baked, poached, roasted, or steamed. These are often made with less sodium. General information Try to limit your sodium intake to 1,500-2,300 mg each day, or the amount told by your provider. What foods should I eat? Fruits Fresh, frozen, or canned fruit. Fruit juice. Vegetables Fresh or frozen vegetables. "No salt added" canned vegetables. "No salt added" tomato sauce and paste. Low-sodium or reduced-sodium tomato and vegetable juice. Grains Low-sodium cereals, such as oats, puffed wheat and rice, and shredded wheat. Low-sodium crackers. Unsalted rice. Unsalted pasta. Low-sodium bread. Whole grain breads and whole grain pasta. Meats and other proteins Fresh or frozen meat, poultry, seafood, and fish. These should have no added salt. Low-sodium canned tuna and salmon. Unsalted nuts. Dried peas, beans, and lentils without added salt. Unsalted  canned beans. Eggs. Unsalted nut butters. Dairy Milk. Soy milk. Cheese that is naturally low in sodium, such as ricotta cheese, fresh mozzarella, or Swiss cheese. Low-sodium or reduced-sodium cheese. Cream cheese. Yogurt. Seasonings and condiments Fresh and dried herbs and spices. Salt-free seasonings. Low-sodium mustard and ketchup. Sodium-free salad dressing. Sodium-free light mayonnaise. Fresh or refrigerated horseradish. Lemon juice. Vinegar. Other foods Homemade, reduced-sodium, or low-sodium soups. Unsalted popcorn and  pretzels. Low-salt or salt-free chips. The items listed above may not be all the foods and drinks you can have. Talk to a dietitian to learn more. What foods should I avoid? Vegetables Sauerkraut, pickled vegetables, and relishes. Olives. [place] fries. Onion rings. Regular canned vegetables, except low-sodium or reduced-sodium items. Regular canned tomato sauce and paste. Regular tomato and vegetable juice. Frozen vegetables in sauces. Grains Instant hot cereals. Bread stuffing, pancake, and biscuit mixes. Croutons. Seasoned rice or pasta mixes. Noodle soup cups. Boxed or frozen macaroni and cheese. Regular salted crackers. Self-rising flour. Meats and other proteins Meat or fish that is salted, canned, smoked, spiced, or pickled. Precooked or cured meat, such as sausages or meat loaves. Tomasa Blase. Ham. Pepperoni. Hot dogs. Corned beef. Chipped beef. Salt pork. Jerky. Pickled herring, anchovies, and sardines. Regular canned tuna. Salted nuts. Dairy Processed cheese and cheese spreads. Hard cheeses. Cheese curds. Blue cheese. Feta cheese. String cheese. Regular cottage cheese. Buttermilk. Canned milk. Fats and oils Salted butter. Regular margarine. Ghee. Bacon fat. Seasonings and condiments Onion salt, garlic salt, seasoned salt, table salt, and sea salt. Canned and packaged gravies. Worcestershire sauce. Tartar sauce. Barbecue sauce. Teriyaki sauce. Soy sauce, including reduced-sodium soy sauce. Steak sauce. Fish sauce. Oyster sauce. Cocktail sauce. Horseradish that you find on the shelf. Regular ketchup and mustard. Meat flavorings and tenderizers. Bouillon cubes. Hot sauce. Pre-made or packaged marinades. Pre-made or packaged taco seasonings. Relishes. Regular salad dressings. Salsa. Other foods Salted popcorn and pretzels. Corn chips and puffs. Potato and tortilla chips. Canned or dried soups. Pizza. Frozen entrees and pot pies. The items listed above may not be all the foods and drinks you should  avoid. Talk to a dietitian to learn more. This information is not intended to replace advice given to you by your health care provider. Make sure you discuss any questions you have with your health care provider. Document Revised: 12/17/2022 Document Reviewed: 12/17/2022 Elsevier Patient Education  2024 [employer].

## 2023-08-18 ENCOUNTER — Ambulatory Visit: Payer: Commercial Managed Care - PPO

## 2023-09-27 DIAGNOSIS — L820 Inflamed seborrheic keratosis: Secondary | ICD-10-CM | POA: Diagnosis not present

## 2023-09-27 DIAGNOSIS — K130 Diseases of lips: Secondary | ICD-10-CM | POA: Diagnosis not present

## 2023-09-27 DIAGNOSIS — L821 Other seborrheic keratosis: Secondary | ICD-10-CM | POA: Diagnosis not present

## 2023-09-27 DIAGNOSIS — Z85828 Personal history of other malignant neoplasm of skin: Secondary | ICD-10-CM | POA: Diagnosis not present

## 2023-09-27 DIAGNOSIS — L570 Actinic keratosis: Secondary | ICD-10-CM | POA: Diagnosis not present

## 2023-10-05 ENCOUNTER — Other Ambulatory Visit (HOSPITAL_COMMUNITY): Payer: Self-pay

## 2023-10-05 MED ORDER — FLUOCINONIDE 0.05 % EX SOLN
1.0000 | Freq: Two times a day (BID) | CUTANEOUS | 0 refills | Status: AC
  Filled 2023-10-05 – 2023-10-06 (×2): qty 20, 7d supply, fill #0

## 2023-10-06 ENCOUNTER — Other Ambulatory Visit (HOSPITAL_COMMUNITY): Payer: Self-pay

## 2023-10-06 ENCOUNTER — Other Ambulatory Visit: Payer: Self-pay

## 2024-09-13 DIAGNOSIS — C4492 Squamous cell carcinoma of skin, unspecified: Secondary | ICD-10-CM

## 2024-09-13 HISTORY — DX: Squamous cell carcinoma of skin, unspecified: C44.92

## 2024-10-02 DIAGNOSIS — C4491 Basal cell carcinoma of skin, unspecified: Secondary | ICD-10-CM

## 2024-10-02 HISTORY — DX: Basal cell carcinoma of skin, unspecified: C44.91

## 2024-10-10 ENCOUNTER — Encounter: Payer: Self-pay | Admitting: Family Medicine
